# Patient Record
Sex: Female | Born: 1995 | Race: White | Hispanic: No | Marital: Single | Smoking: Never smoker
Health system: Southern US, Community
[De-identification: ages and names within clinical notes are randomized; demographics above are authoritative.]

## PROBLEM LIST (undated history)

## (undated) DIAGNOSIS — Z789 Other specified health status: Secondary | ICD-10-CM

## (undated) DIAGNOSIS — R011 Cardiac murmur, unspecified: Secondary | ICD-10-CM

## (undated) HISTORY — DX: Other specified health status: Z78.9

## (undated) HISTORY — PX: NO PAST SURGERIES: SHX2092

---

## 2018-08-08 NOTE — L&D Delivery Note (Addendum)
Delivery Note:  G2P0010 at [redacted]w[redacted]d  Admitting diagnosis: 38WKS CTX Risks: None Onset of labor: 06/18/2019 @ 0500 Augmentation: None ROM: SROM 06/18/2019 @ 1532  Complete dilation at 06/18/2019  @ 1440 Onset of pushing at 1644 FHR second stage Category 1  Analgesia /Anesthesia intrapartum:Epidural  Pushing in lithotomy position  Patient's step-mom, CNM, RN, SNM present for birth and supportive  Delivery of a Live born female  Birth Weight:  pending APGAR: 9, 9  Newborn Delivery   Birth date/time: 06/18/2019 17:26:00 Delivery type: Vaginal, Spontaneous      in vertex presentation, position ROT to ROA.  Nuchal Cord: No  Cord double clamped after cessation of pulsation, cut by patient's step-mom.  Collection of cord blood for typing completed. Cord blood donation-None  Arterial cord blood sample-No    Placenta delivered-Spontaneous  with 3 vessels . Uterotonics: IV Pitocin Placenta to L&D to be discarded. Uterine tone firm, bleeding light.  2nd degree  laceration, complete right labial laceration, left labial laceration identified.  2nd degree repaired with 2-0 Vicryl Rapide in standard fashion.   Complete right labial laceration and left labial laceration repaired with 3-0 vicryl rapide in standard fashion.  Episiotomy:None  Local analgesia: 1% lidocaine administered for repair  Est. Blood Loss (WL):798.92   Complications: None  APGAR:1 min-9 , 5 min-9  10 min-   Mom to postpartum.  Baby to Couplet care / Skin to Skin.  Delivery Report:   Review the Delivery Report for details.     Signed: Juanna Cao, SNM, BSN 06/18/2019, 6:24 PM  Midwife attestation: I was gloved and present for delivery in its entirety and I agree with the above student's note.  Julianne Handler, CNM 6:54 PM

## 2018-11-26 ENCOUNTER — Encounter (HOSPITAL_COMMUNITY): Payer: Self-pay | Admitting: Emergency Medicine

## 2018-11-26 ENCOUNTER — Other Ambulatory Visit: Payer: Self-pay

## 2018-11-26 ENCOUNTER — Emergency Department (HOSPITAL_COMMUNITY)
Admission: EM | Admit: 2018-11-26 | Discharge: 2018-11-26 | Disposition: A | Discharge status: Home / Self Care | Payer: Medicaid Other | Attending: Emergency Medicine | Admitting: Emergency Medicine

## 2018-11-26 ENCOUNTER — Emergency Department (HOSPITAL_COMMUNITY): Payer: Medicaid Other

## 2018-11-26 DIAGNOSIS — O219 Vomiting of pregnancy, unspecified: Secondary | ICD-10-CM | POA: Diagnosis not present

## 2018-11-26 DIAGNOSIS — Z3A09 9 weeks gestation of pregnancy: Secondary | ICD-10-CM | POA: Insufficient documentation

## 2018-11-26 DIAGNOSIS — O210 Mild hyperemesis gravidarum: Secondary | ICD-10-CM | POA: Diagnosis not present

## 2018-11-26 LAB — BASIC METABOLIC PANEL
Anion gap: 12 (ref 5–15)
BUN: 9 mg/dL (ref 6–20)
CO2: 21 mmol/L — ABNORMAL LOW (ref 22–32)
Calcium: 9.3 mg/dL (ref 8.9–10.3)
Chloride: 102 mmol/L (ref 98–111)
Creatinine, Ser: 0.43 mg/dL — ABNORMAL LOW (ref 0.44–1.00)
GFR calc Af Amer: >60 mL/min (ref >60)
GFR calc non Af Amer: >60 mL/min (ref >60)
Glucose, Bld: 74 mg/dL (ref 70–99)
Potassium: 3.4 mmol/L — ABNORMAL LOW (ref 3.5–5.1)
Sodium: 135 mmol/L (ref 135–145)

## 2018-11-26 LAB — CBC WITH DIFFERENTIAL/PLATELET
Abs Immature Granulocytes: 0.05 10*3/uL (ref 0.00–0.07)
Basophils Absolute: 0.1 10*3/uL (ref 0.0–0.1)
Basophils Relative: 0 %
Eosinophils Absolute: 0.1 10*3/uL (ref 0.0–0.5)
Eosinophils Relative: 1 %
HCT: 34.3 % — ABNORMAL LOW (ref 36.0–46.0)
Hemoglobin: 11.7 g/dL — ABNORMAL LOW (ref 12.0–15.0)
Immature Granulocytes: 0 %
Lymphocytes Relative: 17 %
Lymphs Abs: 2.1 10*3/uL (ref 0.7–4.0)
MCH: 30.2 pg (ref 26.0–34.0)
MCHC: 34.1 g/dL (ref 30.0–36.0)
MCV: 88.4 fL (ref 80.0–100.0)
Monocytes Absolute: 0.8 10*3/uL (ref 0.1–1.0)
Monocytes Relative: 6 %
Neutro Abs: 9.5 10*3/uL — ABNORMAL HIGH (ref 1.7–7.7)
Neutrophils Relative %: 76 %
Platelets: 257 10*3/uL (ref 150–400)
RBC: 3.88 MIL/uL (ref 3.87–5.11)
RDW: 13.6 % (ref 11.5–15.5)
WBC: 12.6 10*3/uL — ABNORMAL HIGH (ref 4.0–10.5)
nRBC: 0 % (ref 0.0–0.2)

## 2018-11-26 LAB — HCG, QUANTITATIVE, PREGNANCY: hCG, Beta Chain, Quant, S: 147611 m[IU]/mL — ABNORMAL HIGH (ref <5)

## 2018-11-26 MED ORDER — ONDANSETRON HCL 4 MG PO TABS: 4.0000 mg | ORAL_TABLET | Freq: Three times a day (TID) | ORAL | 0 refills | Status: DC | PRN

## 2018-11-26 MED ORDER — ONDANSETRON HCL 4 MG/2ML IJ SOLN
4.0000 mg | Freq: Once | INTRAMUSCULAR | Status: AC
  Administered 2018-11-26: 4 mg via INTRAVENOUS
  Filled 2018-11-26: qty 2

## 2018-11-26 MED ORDER — SODIUM CHLORIDE 0.9 % IV BOLUS
1000.0000 mL | Freq: Once | INTRAVENOUS | Status: AC
  Administered 2018-11-26: 1000 mL via INTRAVENOUS

## 2018-11-26 NOTE — Discharge Instructions (Addendum)
Your ultrasound did not show any significant abnormalities.  This time vomiting is not uncommon in the first trimester.  You will need to follow-up with GYN for further care.  Would suggest taking prenatal vitamins as well.

## 2018-11-26 NOTE — ED Notes (Signed)
Pt returned from ultrasound

## 2018-11-26 NOTE — ED Triage Notes (Signed)
Pt st's is is approx 9 weeks preg.  Has not seen a doctor but had a positive home preg test.  Pt sts she has been vomiting and unable to keep anything down.  Pt denies any abd pain or cramping.  LMP 09/21/2018

## 2018-11-26 NOTE — ED Notes (Signed)
Pt st's she is feeling much better at this time

## 2018-11-27 NOTE — ED Provider Notes (Signed)
[facility] EMERGENCY DEPARTMENT Provider Note   [id number] Ar[facility] date & time: 11/26/18  1712    History   Chief Complaint Chief Complaint  Patient presents with   Emesis During Pregnancy    HPI Ashlee Johnson is a 23 y.o. female.     HPI Patient presents to the emergency department with being about [redacted] weeks pregnant and concerned because she has not had any follow-up.  She states she is been vomiting fair amount over the last 4 weeks.  Patient states that nothing seems to make her condition better or worse.  The patient denies chest pain, shortness of breath, headache,blurred vision, neck pain, fever, cough, weakness, numbness, dizziness, anorexia, edema, abdominal pain, , diarrhea, rash, back pain, dysuria, hematemesis, bloody stool, near syncope, or syncope. History reviewed. No pertinent past medical history.  There are no active problems to display for this patient.   History reviewed. No pertinent surgical history.   OB History    Gravida  1   Para      Term      Preterm      AB      Living        SAB      TAB      Ectopic      Multiple      Live Births               Home Medications    Prior to Admission medications   Medication Sig Start Date End Date Taking? Authorizing Provider  Prenatal Vit-Fe Fumarate-FA (MULTIVITAMIN-PRENATAL) 27-0.8 MG TABS tablet Take 1 tablet by mouth daily at 12 noon.   Yes [provider]  ondansetron (ZOFRAN) 4 MG tablet Take 1 tablet (4 mg total) by mouth every 8 (eight) hours as needed for nausea or vomiting. 11/26/18   Marvette Schamp, Cristal Deerhristopher, PA-C    Family History No family history on file.  Social History Social History   Tobacco Use   Smoking status: Never Smoker   Smokeless tobacco: Former Engineer, waterUser  Substance Use Topics   Alcohol use: Never    Frequency: Never   Drug use: Never     Allergies   Percocet [oxycodone-acetaminophen]   Review of Systems Review of  Systems All other systems negative except as documented in the HPI. All pertinent positives and negatives as reviewed in the HPI.  Physical Exam Updated Vital Signs BP 98/62    Pulse 72    Temp 98.9 F (37.2 C) (Oral)    Resp 16    Ht 5\' 4"  (1.626 m)    Wt 52.6 kg    LMP 09/21/2018 (Exact Date)    SpO2 100%    BMI 19.91 kg/m   Physical Exam Vitals signs and nursing note reviewed.  Constitutional:      General: She is not in acute distress.    Appearance: She is well-developed.  HENT:     Head: Normocephalic and atraumatic.  Eyes:     Pupils: Pupils are equal, round, and reactive to light.  Neck:     Musculoskeletal: Normal range of motion and neck supple.  Cardiovascular:     Rate and Rhythm: Normal rate and regular rhythm.     Heart sounds: Normal heart sounds. No murmur. No friction rub. No gallop.   Pulmonary:     Effort: Pulmonary effort is normal. No respiratory distress.     Breath sounds: Normal breath sounds. No wheezing.  Abdominal:  General: Bowel sounds are normal. There is no distension.     Palpations: Abdomen is soft.     Tenderness: There is no abdominal tenderness.  Skin:    General: Skin is warm and dry.     Capillary Refill: Capillary refill takes less than 2 seconds.     Findings: No erythema or rash.  Neurological:     Mental Status: She is alert and oriented to person, place, and time.     Motor: No abnormal muscle tone.     Coordination: Coordination normal.  Psychiatric:        Behavior: Behavior normal.      ED Treatments / Results  Labs (all labs ordered are listed, but only abnormal results are displayed) Labs Reviewed  BASIC METABOLIC PANEL - Abnormal; Notable for the following components:      Result Value   Potassium 3.4 (*)    CO2 21 (*)    Creatinine, Ser 0.43 (*)    All other components within normal limits  CBC WITH DIFFERENTIAL/PLATELET - Abnormal; Notable for the following components:   WBC 12.6 (*)    Hemoglobin 11.7 (*)      HCT 34.3 (*)    Neutro Abs 9.5 (*)    All other components within normal limits  HCG, QUANTITATIVE, PREGNANCY - Abnormal; Notable for the following components:   hCG, Beta Chain, Quant, S 147,611 (*)    All other components within normal limits    EKG None  Radiology US Ob Comp Less 14 Wks  Result Date: 11/26/2018 CLINICAL DATA:  Abdominal pain EXAM: OBSTETRIC <14 WK [place] AND TRANSVAGINAL OB US TECHNIQUE: Both transabdominal and transvaginal ultrasound examinations were performed for complete evaluation of the gestation as well as the maternal uterus, adnexal regions, and pelvic cul-de-sac. Transvaginal technique was performed to assess early pregnancy. COMPARISON:  None. FINDINGS: Intrauterine gestational sac: Single Yolk sac:  Visualized Embryo:  Visualized Cardiac Activity: Visualized Heart Rate: 149 bpm MSD:   mm    w     d CRL:  24.2 mm   9 w   1 d                  [place] EDC: 06/30/2019 Subchorionic hemorrhage:  Small subchorionic hemorrhage Maternal uterus/adnexae: No adnexal mass or free fluid. Complex corpus luteal cyst in the right ovary measures 2.1 cm. IMPRESSION: 9 week 1 day intrauterine pregnancy. Fetal heart rate 149 beats per minute. Small subchorionic hemorrhage. Electronically Signed   By: Charlett Nose M.D.   On: 11/26/2018 21:59   US Ob Transvaginal  Result Date: 11/26/2018 CLINICAL DATA:  Abdominal pain EXAM: OBSTETRIC <14 WK [place] AND TRANSVAGINAL OB US TECHNIQUE: Both transabdominal and transvaginal ultrasound examinations were performed for complete evaluation of the gestation as well as the maternal uterus, adnexal regions, and pelvic cul-de-sac. Transvaginal technique was performed to assess early pregnancy. COMPARISON:  None. FINDINGS: Intrauterine gestational sac: Single Yolk sac:  Visualized Embryo:  Visualized Cardiac Activity: Visualized Heart Rate: 149 bpm MSD:   mm    w     d CRL:  24.2 mm   9 w   1 d                  [place] EDC: 06/30/2019 Subchorionic hemorrhage:  Small  subchorionic hemorrhage Maternal uterus/adnexae: No adnexal mass or free fluid. Complex corpus luteal cyst in the right ovary measures 2.1 cm. IMPRESSION: 9 week 1 day intrauterine pregnancy. Fetal heart rate 149 beats  per minute. Small subchorionic hemorrhage. Electronically Signed   By: Charlett Nose M.D.   On: 11/26/2018 21:59    Procedures Procedures (including critical care time)  Medications Ordered in ED Medications  sodium chloride 0.9 % bolus 1,000 mL (0 mLs Intravenous Stopped 11/26/18 2048)  ondansetron (ZOFRAN) injection 4 mg (4 mg Intravenous Given 11/26/18 1856)     Initial Impression / Assessment and Plan / ED Course  I have reviewed the triage vital signs and the nursing notes.  Pertinent labs & imaging results that were available during my care of the patient were reviewed by me and considered in my medical decision making (see chart for details).        Patient will be referred to GYN.  Her ultrasound and laboratory testing does not yield any significant abnormalities.  I do feel she is dehydrated.  Given IV fluids and antiemetics.  Patient is feeling better at this time.  Final Clinical Impressions(s) / ED Diagnoses   Final diagnoses:  Vomiting pregnancy    ED Discharge Orders         Ordered    ondansetron (ZOFRAN) 4 MG tablet  Every 8 hours PRN     11/26/18 2216           Charlestine Night, PA-C 11/27/18 2341    Sabas Sous, MD 11/28/18 1504

## 2018-12-10 ENCOUNTER — Other Ambulatory Visit: Payer: Self-pay

## 2018-12-10 ENCOUNTER — Ambulatory Visit (INDEPENDENT_AMBULATORY_CARE_PROVIDER_SITE_OTHER): Payer: Self-pay | Admitting: *Deleted

## 2018-12-10 VITALS — Wt 120.0 lb

## 2018-12-10 DIAGNOSIS — Z113 Encounter for screening for infections with a predominantly sexual mode of transmission: Secondary | ICD-10-CM

## 2018-12-10 DIAGNOSIS — Z124 Encounter for screening for malignant neoplasm of cervix: Secondary | ICD-10-CM

## 2018-12-10 DIAGNOSIS — Z3400 Encounter for supervision of normal first pregnancy, unspecified trimester: Secondary | ICD-10-CM | POA: Insufficient documentation

## 2018-12-10 MED ORDER — VITAFOL GUMMIES 3.33-0.333-34.8 MG PO CHEW: 3.0000 | CHEWABLE_TABLET | Freq: Every day | ORAL | 12 refills | Status: DC

## 2018-12-10 MED ORDER — PROMETHAZINE HCL 25 MG PO TABS: 25.0000 mg | ORAL_TABLET | Freq: Four times a day (QID) | ORAL | 0 refills | Status: DC | PRN

## 2018-12-10 NOTE — Progress Notes (Signed)
    Virtual Visit via Telephone Note  I connected with Ashlee Johnson on 12/10/18 at  1:30 PM EDT by telephone and verified that I am speaking with the correct person using two identifiers.  Location: [facility] Patient: Ashlee Johnson Provider: Dorisann Frames, BSN, RN-BC   I discussed the limitations, risks, security and privacy concerns of performing an evaluation and management service by telephone and the availability of in person appointments. I also discussed with the patient that there may be a patient responsible charge related to this service. The patient expressed understanding and agreed to proceed.   History of Present Illness: PRENATAL INTAKE SUMMARY  [name] presents today New OB Nurse Interview.  OB History    Gravida  2   Para      Term      Preterm      AB  1   Living        SAB  1   TAB      Ectopic      Multiple      Live Births             I have reviewed the patient's medical, obstetrical, social, and family histories, medications, and available lab results.  SUBJECTIVE She has no unusual complaints and complains of nausea with vomiting off and on since April 20 th, 2020.    Observations/Objective: Initial nurse interview for history (New OB).  GENERAL APPEARANCE: oriented to person, place and time, non-face to face interview.  EDD: 06/28/2019 by LMP GA: [redacted]w[redacted]d G2P0010 FHT: non-face to face  Assessment and Plan: Normal pregnancy Prenatal lab work/physical exam will be completed at next visit with midwife. Patient to enroll in Babyscripts Prenatal gummies Rx sent to pharmacy Phenergan 25 mg PO Rx sent to pharmacy for n/v  Follow Up Instructions:   I discussed the assessment and treatment plan with the patient. The patient was provided an opportunity to ask questions and all were answered. The patient agreed with the plan and demonstrated an understanding of the instructions.   The patient was advised to call back or  seek an in-person evaluation if the symptoms worsen or if the condition fails to improve as anticipated.  I provided 15 minutes of non-face-to-face time during this encounter.   Ashlee Pu, RN

## 2019-01-02 ENCOUNTER — Other Ambulatory Visit: Payer: Self-pay

## 2019-01-02 ENCOUNTER — Encounter: Payer: Self-pay | Admitting: Certified Nurse Midwife

## 2019-01-02 ENCOUNTER — Other Ambulatory Visit (HOSPITAL_COMMUNITY)
Admission: RE | Admit: 2019-01-02 | Discharge: 2019-01-02 | Disposition: A | Discharge status: Home / Self Care | Payer: Medicaid Other | Source: Ambulatory Visit | Attending: Certified Nurse Midwife | Admitting: Certified Nurse Midwife

## 2019-01-02 ENCOUNTER — Ambulatory Visit (INDEPENDENT_AMBULATORY_CARE_PROVIDER_SITE_OTHER): Payer: Medicaid Other | Admitting: Certified Nurse Midwife

## 2019-01-02 VITALS — BP 110/68 | HR 81 | Temp 98.6°F | Wt 124.0 lb

## 2019-01-02 DIAGNOSIS — Z3400 Encounter for supervision of normal first pregnancy, unspecified trimester: Secondary | ICD-10-CM | POA: Diagnosis not present

## 2019-01-02 DIAGNOSIS — O26892 Other specified pregnancy related conditions, second trimester: Secondary | ICD-10-CM | POA: Diagnosis not present

## 2019-01-02 DIAGNOSIS — Z124 Encounter for screening for malignant neoplasm of cervix: Secondary | ICD-10-CM

## 2019-01-02 DIAGNOSIS — Z113 Encounter for screening for infections with a predominantly sexual mode of transmission: Secondary | ICD-10-CM

## 2019-01-02 DIAGNOSIS — N898 Other specified noninflammatory disorders of vagina: Secondary | ICD-10-CM | POA: Insufficient documentation

## 2019-01-02 DIAGNOSIS — Z3A14 14 weeks gestation of pregnancy: Secondary | ICD-10-CM | POA: Diagnosis not present

## 2019-01-02 MED ORDER — TERCONAZOLE 0.4 % VA CREA: 1.0000 | TOPICAL_CREAM | Freq: Every day | VAGINAL | 0 refills | Status: DC

## 2019-01-02 MED ORDER — PROMETHAZINE HCL 25 MG PO TABS: 25.0000 mg | ORAL_TABLET | Freq: Four times a day (QID) | ORAL | 1 refills | Status: DC | PRN

## 2019-01-03 LAB — OBSTETRIC PANEL, INCLUDING HIV
Antibody Screen: NEGATIVE
Basophils Absolute: 0.1 x10E3/uL (ref 0.0–0.2)
Basos: 0 %
EOS (ABSOLUTE): 0.2 x10E3/uL (ref 0.0–0.4)
Eos: 1 %
HIV Screen 4th Generation wRfx: NONREACTIVE
Hematocrit: 35.6 % (ref 34.0–46.6)
Hemoglobin: 12 g/dL (ref 11.1–15.9)
Hepatitis B Surface Ag: NEGATIVE
Immature Grans (Abs): 0 x10E3/uL (ref 0.0–0.1)
Immature Granulocytes: 0 %
Lymphocytes Absolute: 2.6 x10E3/uL (ref 0.7–3.1)
Lymphs: 20 %
MCH: 30.3 pg (ref 26.6–33.0)
MCHC: 33.7 g/dL (ref 31.5–35.7)
MCV: 90 fL (ref 79–97)
Monocytes Absolute: 0.8 x10E3/uL (ref 0.1–0.9)
Monocytes: 6 %
Neutrophils Absolute: 9.5 x10E3/uL — ABNORMAL HIGH (ref 1.4–7.0)
Neutrophils: 73 %
Platelets: 308 x10E3/uL (ref 150–450)
RBC: 3.96 x10E6/uL (ref 3.77–5.28)
RDW: 13.5 % (ref 11.7–15.4)
RPR Ser Ql: NONREACTIVE
Rh Factor: POSITIVE
Rubella Antibodies, IGG: 1.72 {index}
WBC: 13.2 x10E3/uL — ABNORMAL HIGH (ref 3.4–10.8)

## 2019-01-03 NOTE — Progress Notes (Signed)
History:   Ashlee Johnson is a 23 y.o. G2P0010 at 8775w6d by LMP with is consistent with early US at 5030w1d being seen today for her first obstetrical visit.  Her obstetrical history is significant for nothing. Patient does intend to breast feed. Pregnancy history fully reviewed.  Patient reports no complaints.     HISTORY: OB History  Gravida Para Term Preterm AB Living  2 0 0 0 1 0  SAB TAB Ectopic Multiple Live Births  1 0 0 0 0    # Outcome Date GA Lbr Len/2nd Weight Sex Delivery Anes PTL Lv  2 Current           1 SAB 2018            She has never had pap smear   Past Medical History:  Diagnosis Date  . Medical history non-contributory    Past Surgical History:  Procedure Laterality Date  . NO PAST SURGERIES     Family History  Problem Relation Age of Onset  . Heart disease Father   . Cancer Father    Social History   Tobacco Use  . Smoking status: Never Smoker  . Smokeless tobacco: Never Used  Substance Use Topics  . Alcohol use: Not Currently    Frequency: Never  . Drug use: Never   Allergies  Allergen Reactions  . Percocet [Oxycodone-Acetaminophen] Hives and Nausea And Vomiting   Current Outpatient Medications on File Prior to Visit  Medication Sig Dispense Refill  . Prenatal Vit-Fe Phos-FA-Omega (VITAFOL GUMMIES) 3.33-0.333-34.8 MG CHEW Chew 3 each by mouth daily. 90 tablet 12   No current facility-administered medications on file prior to visit.     Review of Systems Pertinent items noted in HPI and remainder of comprehensive ROS otherwise negative. Physical Exam:   Vitals:   01/02/19 1522  BP: 110/68  Pulse: 81  Temp: 98.6 F (37 C)  Weight: 124 lb (56.2 kg)   Fetal Heart Rate (bpm): 160 Pelvic Exam: Perineum: no hemorrhoids, normal perineum   Vulva: normal external genitalia, no lesions   Vagina:  normal mucosa, large amount of yellow curdy discharge with active clear mucous discharge from cervical os, with odor, cervix friable with pap  smear    Cervix: no lesions and normal, pap smear done.    Adnexa: normal adnexa and no mass, fullness, tenderness   Bony Pelvis: average  System: General: well-developed, well-nourished female in no acute distress   Breasts:  normal appearance, no masses or tenderness bilaterally   Skin: normal coloration and turgor, no rashes   Neurologic: oriented, normal, negative, normal mood   Extremities: normal strength, tone, and muscle mass, ROM of all joints is normal   HEENT PERRLA, extraocular movement intact and sclera clear   Mouth/Teeth mucous membranes moist, pharynx normal without lesions and dental hygiene good   Neck supple and no masses   Cardiovascular: regular rate and rhythm   Respiratory:  no respiratory distress, normal breath sounds   Abdomen: soft, non-tender; bowel sounds normal; no masses,  no organomegaly     Assessment:    Pregnancy: G2P0010 Patient Active Problem List   Diagnosis Date Noted  . Vaginal discharge during pregnancy in second trimester 01/02/2019  . Supervision of normal first pregnancy, antepartum 12/10/2018     Plan:    1. Supervision of normal first pregnancy, antepartum - Welcomed to practice and introduced self to patient  - Anticipatory guidance on appointment during COVID19 pandemic  - COVID19 precautions discussed  -  Order for anatomy US placed  - Reviewed use of babyscripts app  - Cervicovaginal ancillary only( [place]) - Cytology - PAP( [place]) - Culture, OB Urine - Genetic Screening - Obstetric Panel, Including HIV - promethazine (PHENERGAN) 25 MG tablet; Take 1 tablet (25 mg total) by mouth every 6 (six) hours as needed for nausea or vomiting.  Dispense: 30 tablet; Refill: 1 - [place] MFM OB COMP + 14 WK; Future  2. Screening examination for STD (sexually transmitted disease) - Cervicovaginal ancillary only( [place])  3. Pap smear for cervical cancer screening - Cytology - PAP( [place])  4. Vaginal discharge during  pregnancy in second trimester - Will prophylactically treat for yeast infection  - Results of cervicovaginal ancillary swab pending, will manage accordingly  - terconazole (TERAZOL 7) 0.4 % vaginal cream; Place 1 applicator vaginally at bedtime. Use for seven days  Dispense: 45 g; Refill: 0   Initial labs drawn. Continue prenatal vitamins. Genetic Screening discussed, NIPS: ordered. Ultrasound discussed; fetal anatomic survey: ordered. Problem list reviewed and updated. The nature of  - [facility] with multiple MDs and other Advanced Practice Providers was explained to patient; also emphasized that residents, students are part of our team. Routine obstetric precautions reviewed. Return in about 30 days (around 02/01/2019) for ROB-mychart visit .     Sharyon Cable, CNM Center for [employer], [facility]

## 2019-01-04 ENCOUNTER — Encounter: Payer: Self-pay | Admitting: General Practice

## 2019-01-04 LAB — CULTURE, OB URINE

## 2019-01-04 LAB — URINE CULTURE, OB REFLEX

## 2019-01-07 LAB — CERVICOVAGINAL ANCILLARY ONLY
Bacterial vaginitis: NEGATIVE
Candida vaginitis: POSITIVE — AB
Chlamydia: NEGATIVE
Neisseria Gonorrhea: NEGATIVE
Trichomonas: NEGATIVE

## 2019-01-08 LAB — CYTOLOGY - PAP: Diagnosis: NEGATIVE

## 2019-01-09 ENCOUNTER — Telehealth: Payer: Self-pay | Admitting: *Deleted

## 2019-01-09 NOTE — Telephone Encounter (Signed)
Patient called regarding test results from last office visit. Patient's PAP was normal and +yeast. Pt is almost complete with vaginal yeast cream.  No other concerns.  Clovis Pu, RN

## 2019-01-10 ENCOUNTER — Encounter: Payer: Self-pay | Admitting: General Practice

## 2019-01-14 ENCOUNTER — Encounter: Payer: Self-pay | Admitting: General Practice

## 2019-01-30 ENCOUNTER — Telehealth (INDEPENDENT_AMBULATORY_CARE_PROVIDER_SITE_OTHER): Payer: Medicaid Other | Admitting: Obstetrics and Gynecology

## 2019-01-30 ENCOUNTER — Encounter: Payer: Self-pay | Admitting: Obstetrics and Gynecology

## 2019-01-30 DIAGNOSIS — N898 Other specified noninflammatory disorders of vagina: Secondary | ICD-10-CM

## 2019-01-30 DIAGNOSIS — O26892 Other specified pregnancy related conditions, second trimester: Secondary | ICD-10-CM

## 2019-01-30 DIAGNOSIS — Z3400 Encounter for supervision of normal first pregnancy, unspecified trimester: Secondary | ICD-10-CM

## 2019-01-30 DIAGNOSIS — Z3A18 18 weeks gestation of pregnancy: Secondary | ICD-10-CM

## 2019-01-30 MED ORDER — BLOOD PRESSURE MONITOR AUTOMAT DEVI: 0 refills | Status: DC

## 2019-01-30 NOTE — Progress Notes (Signed)
   MY CHART VIDEO VIRTUAL OBSTETRICS VISIT ENCOUNTER NOTE  I connected with Ashlee Johnson on 01/30/19 at  1:50 PM EDT by My Chart video at home and verified that I am speaking with the correct person using two identifiers.   I discussed the limitations, risks, security and privacy concerns of performing an evaluation and management service by My Chart video and the availability of in person appointments. I also discussed with the patient that there may be a patient responsible charge related to this service. The patient expressed understanding and agreed to proceed.  Subjective:  Ashlee Johnson is a 23 y.o. G2P0010 at [redacted]w[redacted]d being followed for ongoing prenatal care.  She is currently monitored for the following issues for this low-risk pregnancy and has Supervision of normal first pregnancy, antepartum and Vaginal discharge during pregnancy in second trimester on their problem list.  Patient reports no complaints. Reports fetal movement. Denies any contractions, bleeding or leaking of fluid.   The following portions of the patient's history were reviewed and updated as appropriate: allergies, current medications, past family history, past medical history, past social history, past surgical history and problem list.   Objective:   General:  Alert, oriented and cooperative.   Mental Status: Normal mood and affect perceived. Normal judgment and thought content.  Rest of physical exam deferred due to type of encounter  Assessment and Plan:  Pregnancy: G2P0010 at [redacted]w[redacted]d 1. Vaginal discharge during pregnancy in second trimester - Not irritating, possibly normal for gestation  2. Supervision of normal first pregnancy, antepartum - Blood Pressure Monitoring (BLOOD PRESSURE MONITOR AUTOMAT) DEVI; Appropriate size cuff (forearm circumference) with automatic BP monitor. To be monitored regularly at home. ICD-10 code: Z34.80, Supervision of other normal pregnancy.  Dispense: 1 Device; Refill: 0   Preterm labor symptoms and general obstetric precautions including but not limited to vaginal bleeding, contractions, leaking of fluid and fetal movement were reviewed in detail with the patient.  I discussed the assessment and treatment plan with the patient. The patient was provided an opportunity to ask questions and all were answered. The patient agreed with the plan and demonstrated an understanding of the instructions. The patient was advised to call back or seek an in-person office evaluation/go to [facility]U at [facility] for any urgent or concerning symptoms. Please refer to After Visit Summary for other counseling recommendations.   I provided 5 minutes of non-face-to-face time during this encounter. There was 5 minutes of chart review time spent prior to this encounter. Total time spent = 10 minutes.   Return in about 6 weeks (around 03/13/2019) for Return OB - My Chart video.  Future Appointments  Date Time Provider [place]  02/01/2019 10:30 AM WH-MFC [place] 1 WH-MFCUS MFC-US    Valerio Pinard, [place] for [employer], [place]

## 2019-02-01 ENCOUNTER — Ambulatory Visit (HOSPITAL_COMMUNITY)
Admission: RE | Admit: 2019-02-01 | Discharge: 2019-02-01 | Disposition: A | Discharge status: Home / Self Care | Payer: Medicaid Other | Source: Ambulatory Visit | Attending: Obstetrics and Gynecology | Admitting: Obstetrics and Gynecology

## 2019-02-01 ENCOUNTER — Other Ambulatory Visit (HOSPITAL_COMMUNITY): Payer: Self-pay | Admitting: *Deleted

## 2019-02-01 ENCOUNTER — Other Ambulatory Visit: Payer: Self-pay

## 2019-02-01 DIAGNOSIS — Z363 Encounter for antenatal screening for malformations: Secondary | ICD-10-CM | POA: Diagnosis not present

## 2019-02-01 DIAGNOSIS — Z362 Encounter for other antenatal screening follow-up: Secondary | ICD-10-CM

## 2019-02-01 DIAGNOSIS — Z3400 Encounter for supervision of normal first pregnancy, unspecified trimester: Secondary | ICD-10-CM | POA: Diagnosis not present

## 2019-02-01 DIAGNOSIS — Z3A18 18 weeks gestation of pregnancy: Secondary | ICD-10-CM

## 2019-02-04 DIAGNOSIS — Z3480 Encounter for supervision of other normal pregnancy, unspecified trimester: Secondary | ICD-10-CM | POA: Diagnosis not present

## 2019-03-01 ENCOUNTER — Encounter (HOSPITAL_COMMUNITY): Payer: Self-pay

## 2019-03-01 ENCOUNTER — Ambulatory Visit (HOSPITAL_COMMUNITY): Payer: Medicaid Other | Admitting: *Deleted

## 2019-03-01 ENCOUNTER — Ambulatory Visit (HOSPITAL_COMMUNITY)
Admission: RE | Admit: 2019-03-01 | Discharge: 2019-03-01 | Disposition: A | Discharge status: Home / Self Care | Payer: Medicaid Other | Source: Ambulatory Visit | Attending: Obstetrics and Gynecology | Admitting: Obstetrics and Gynecology

## 2019-03-01 ENCOUNTER — Other Ambulatory Visit: Payer: Self-pay

## 2019-03-01 VITALS — BP 114/57 | HR 88 | Temp 98.2°F

## 2019-03-01 DIAGNOSIS — Z362 Encounter for other antenatal screening follow-up: Secondary | ICD-10-CM

## 2019-03-01 DIAGNOSIS — O0990 Supervision of high risk pregnancy, unspecified, unspecified trimester: Secondary | ICD-10-CM

## 2019-03-01 DIAGNOSIS — Z3A22 22 weeks gestation of pregnancy: Secondary | ICD-10-CM

## 2019-03-07 ENCOUNTER — Other Ambulatory Visit: Payer: Self-pay

## 2019-03-07 ENCOUNTER — Encounter: Payer: Self-pay | Admitting: Obstetrics and Gynecology

## 2019-03-07 ENCOUNTER — Telehealth (INDEPENDENT_AMBULATORY_CARE_PROVIDER_SITE_OTHER): Payer: Medicaid Other | Admitting: Obstetrics and Gynecology

## 2019-03-07 DIAGNOSIS — Z3400 Encounter for supervision of normal first pregnancy, unspecified trimester: Secondary | ICD-10-CM

## 2019-03-07 DIAGNOSIS — Z3A23 23 weeks gestation of pregnancy: Secondary | ICD-10-CM

## 2019-03-07 DIAGNOSIS — N898 Other specified noninflammatory disorders of vagina: Secondary | ICD-10-CM

## 2019-03-07 DIAGNOSIS — O26892 Other specified pregnancy related conditions, second trimester: Secondary | ICD-10-CM

## 2019-03-07 NOTE — Progress Notes (Signed)
   MY CHART VIDEO VIRTUAL OBSTETRICS VISIT ENCOUNTER NOTE  I connected with Ashlee Johnson on 03/07/19 at 10:10 AM EDT by My Chart video at home and verified that I am speaking with the correct person using two identifiers.   I discussed the limitations, risks, security and privacy concerns of performing an evaluation and management service by My Chart video and the availability of in person appointments. I also discussed with the patient that there may be a patient responsible charge related to this service. The patient expressed understanding and agreed to proceed.  Subjective:  Ashlee Johnson is a 23 y.o. G2P0010 at [redacted]w[redacted]d being followed for ongoing prenatal care.  She is currently monitored for the following issues for this low-risk pregnancy and has Supervision of normal first pregnancy, antepartum and Vaginal discharge during pregnancy in second trimester on their problem list.  Patient reports no complaints. Wants to know if she can fly to [place] during the middle of August for 2 days. Reports fetal movement. Denies any contractions, bleeding or leaking of fluid.   The following portions of the patient's history were reviewed and updated as appropriate: allergies, current medications, past family history, past medical history, past social history, past surgical history and problem list.   Objective:   General:  Alert, oriented and cooperative.   Mental Status: Normal mood and affect perceived. Normal judgment and thought content.  Rest of physical exam deferred due to type of encounter  BP 117/79   Pulse 79   LMP 09/21/2018 (Exact Date)  **Done by patient's own at home BP cuff   Assessment and Plan:  Pregnancy: G2P0010 at [redacted]w[redacted]d  Supervision of normal first pregnancy, antepartum  - Anticipatory guidance for 2 hr GTT and labs at nv - Advised it would be safe to travel at the time in pregnancy - Advised to wear mask when in public places or around someone she is unsure of their  COVID-19 status, practice social distancing, wash hands frequently, and monitor self for s/sxs of COVID-19 upon return back to [place] that if she thinks she has been exposed to COVID-19 that she would not need any testing for no symptoms. If she start to develop any symptoms to call her PCP, go to urgent care or ED for further evaluation. If she prefer she can self quarantine for 2 weeks.    Preterm labor symptoms and general obstetric precautions including but not limited to vaginal bleeding, contractions, leaking of fluid and fetal movement were reviewed in detail with the patient.  I discussed the assessment and treatment plan with the patient. The patient was provided an opportunity to ask questions and all were answered. The patient agreed with the plan and demonstrated an understanding of the instructions. The patient was advised to call back or seek an in-person office evaluation/go to [facility] at [facility] for any urgent or concerning symptoms. Please refer to After Visit Summary for other counseling recommendations.   I provided 5 minutes of non-face-to-face time during this encounter. There was 5 minutes of chart review time spent prior to this encounter. Total time spent = 10 minutes.  Return in about 4 weeks (around 04/04/2019) for Return OB 2hr GTT.  Future Appointments  Date Time Provider [place]  04/03/2019  8:10 AM Laury Deep, CNM [facility] None    Laury Deep, [place] for [employer], [place]

## 2019-03-07 NOTE — Patient Instructions (Signed)
Pregnancy and Travel  Most pregnant women can safely travel until the last month of their pregnancy. Your doctor may recommend limiting or avoiding travel depending on how far you are in the pregnancy, and if you have any medical or pregnancy problems. General travel tips Before you go:  Discuss your trip with your doctor. Get examined shortly before you go.  Get a copy of your medical records. Take it with you.  Try to get names of doctors and hospitals in the area where you will be visiting.  Pack your pillow.  Pack any approved medicines and supplements.  Get enough sleep the night before the trip. During your trip:  Ask for locations of doctors and hospitals.  Wear flat, comfortable shoes.  Wear loose-fitting, comfortable clothes.  Wear compression stockings as told by your doctor. They may prevent blood clots that arise from sitting for a long time.  Do leg exercises as told by your doctor.  Eat a balanced diet, drink lots of fluid, and take your vitamins and supplements.  Take water, crackers, and fruit with you.  Take breaks to use the restroom and walk every 2 hours or during stops.  Do not wear yourself out.  Do not ride on a motorcycle.  Rest. If your trip is long, lie down for 30 or more minutes with your feet slightly raised after you reach your destination.  Always wear a seat belt. Tips for traveling to a foreign country Before you go:  Ask your doctor if there are medicines that are safe for you to take if you get diarrhea, constipation, nausea, or vomiting.  Check with your health insurance provider about medical coverage abroad. Purchase travel [employer], if needed.  Make sure you are up to date on vaccines. During your trip:  Do not eat uncooked foods.  Do not eat food from buffets or food that is cold or sitting at room temperature.  Drink bottled beverages and water. Do not use ice.  Wash fruits and vegetables with clean water. If  possible, peel them before eating.  Do not drink unpasteurized milk.  Wear insect repellent if there are mosquitoes or other biting insects. Ask your doctor which repellents are safe. What do I need to know about traveling by car?  Wear your seat belt properly. The belt should be buckled below your abdomen, on your hip bones. The shoulder belt should be off to the side of your abdomen and across the center of your chest.  If you are in the front seat, sit as far away from the dashboard as possible to avoid getting hit hard if the airbag deploys in an accident.  Do not travel for more than 5-6 hours a day. What do I need to know about traveling by bus?  Before making a reservation, ask whether your bus will have a restroom.  Move your arms and legs when seated.  If you have to use the restroom, hold on to the seats and handrails as you walk.  Do not travel for more than 5-6 hours a day. What do I need to know about traveling by train?  Before making a reservation, ask if your train will have a sleeping car and more than one restroom.  If you need to walk while the train is moving, hold on to seats and handrails.  Move your arms and legs when seated.  Do not travel for more than 5-6 hours a day. What do I need to know about traveling  by airplane?  Before booking your trip, ask about the airline's rules about pregnancy. Pregnant women may be restricted from [place] after a certain time of the pregnancy. Every airline has its own rules.  Make sure you complete your trip before 36 weeks of pregnancy.  Ask whether the airplane cabin will be pressurized. Do not board an unpressurized plane that will fly above 7,000 ft (2,100 m).  Try to get a bulkhead or an aisle seat so it is easier to get up, stretch, and use the bathroom.  Wear layers since the cabin temperature can change.  Put all your medicines and medical records in your carry-on bag.  Avoid drinking caffeinated or  carbonated beverages.  Avoid eating foods that may make you bloated.  Do not eat a big meal.  If you need to walk through the airplane, hold on to the seats and handrails.  Move your arms and legs when seated.  Wear your seat belt. What do I need to know about traveling by cruise ship?  Before booking your trip, ask the [place]: ? Are pregnant women allowed on the ship? ? Is there a medical facility and doctor on board? ? Does the ship dock in places where there are doctors and medical facilities?  Before booking your trip, ask your doctor: ? Is it safe to take medicines if I get seasick? ? Is it safe to wear acupressure wristbands to prevent seasickness? If the answer is yes, consider buying one. Contact a health care provider if:  You have diarrhea.  You vomit.  You have nausea or seasickness. Get help right away if:  You have vaginal bleeding.  You have severe vomiting or diarrhea.  You have pelvic or abdominal pain.  You have contractions.  Your water breaks.  You have a persistent headache.  Your eyesight changes or you see spots.  Your face or hands are swollen.  You have pain, warmth, or swelling in your legs or ankles. Summary  Most pregnant women can safely travel until the last month of their pregnancy. Your doctor may tell you to limit or avoid travel depending on how far you are in your pregnancy and if you have any medical or pregnancy problems.  The best time to travel is between 14 and 28 weeks of your pregnancy.  Before you go on your trip, make sure you discuss your trip with your health care provider, get a copy of your medical records, and try to get information on medical centers and doctors at your destination.  While on your trip, make sure you wear comfortable clothes and shoes, eat a healthy diet, drink plenty of fluids, take your vitamins and supplements, take breaks and rest often, and wear your seat belt.  Before booking  a flight, ask about the airline's rules about pregnancy. Every airline has its own rules. Do the same for a train, bus, or cruise ship. This information is not intended to replace advice given to you by your health care provider. Make sure you discuss any questions you have with your health care provider. Document Released: 07/07/2008 Document Revised: 11/16/2018 Document Reviewed: 08/30/2016 Elsevier Patient Education  2020 [employer].

## 2019-04-03 ENCOUNTER — Ambulatory Visit (INDEPENDENT_AMBULATORY_CARE_PROVIDER_SITE_OTHER): Payer: Medicaid Other | Admitting: Obstetrics and Gynecology

## 2019-04-03 ENCOUNTER — Other Ambulatory Visit: Payer: Self-pay

## 2019-04-03 ENCOUNTER — Encounter: Payer: Self-pay | Admitting: Obstetrics and Gynecology

## 2019-04-03 VITALS — BP 106/57 | HR 76 | Temp 98.0°F | Wt 146.0 lb

## 2019-04-03 DIAGNOSIS — Z3400 Encounter for supervision of normal first pregnancy, unspecified trimester: Secondary | ICD-10-CM | POA: Diagnosis not present

## 2019-04-03 DIAGNOSIS — O26892 Other specified pregnancy related conditions, second trimester: Secondary | ICD-10-CM

## 2019-04-03 DIAGNOSIS — R12 Heartburn: Secondary | ICD-10-CM

## 2019-04-03 DIAGNOSIS — Z3A27 27 weeks gestation of pregnancy: Secondary | ICD-10-CM

## 2019-04-03 MED ORDER — FAMOTIDINE 20 MG PO TABS: 20.0000 mg | ORAL_TABLET | Freq: Two times a day (BID) | ORAL | 2 refills | Status: DC

## 2019-04-03 NOTE — Progress Notes (Signed)
   PRENATAL VISIT NOTE  Subjective:  Ashlee Johnson is a 23 y.o. G2P0010 at [redacted]w[redacted]d being seen today for ongoing prenatal care.  She is currently monitored for the following issues for this low-risk pregnancy and has Supervision of normal first pregnancy, antepartum and Vaginal discharge during pregnancy in second trimester on their problem list.  Patient reports no complaints. She just returned yesterday 04/02/2019 from [place]. She states she went to see her FOB and to relax on the beaches. Contractions: Not present. Vag. Bleeding: None.  Movement: Present. Denies leaking of fluid.   The following portions of the patient's history were reviewed and updated as appropriate: allergies, current medications, past family history, past medical history, past social history, past surgical history and problem list.   Objective:   Vitals:   04/03/19 0825  BP: (!) 106/57  Pulse: 76  Temp: 98 F (36.7 C)  Weight: 146 lb (66.2 kg)    Fetal Status: Fetal Heart Rate (bpm): 152 Fundal Height: 25 cm Movement: Present  Presentation: Undeterminable  General:  Alert, oriented and cooperative. Patient is in no acute distress.  Skin: Skin is warm and dry. No rash noted.   Cardiovascular: Normal heart rate noted  Respiratory: Normal respiratory effort, no problems with respiration noted  Abdomen: Soft, gravid, appropriate for gestational age.  Pain/Pressure: Absent     Pelvic: Cervical exam deferred        Extremities: Normal range of motion.  Edema: None  Mental Status: Normal mood and affect. Normal behavior. Normal judgment and thought content.   Assessment and Plan:  Pregnancy: G2P0010 at [redacted]w[redacted]d 1. Supervision of normal first pregnancy, antepartum - Declined TdaP  - Glucose Tolerance, 2 Hours w/1 Hour - CBC - HIV Antibody (routine testing w rflx) - RPR - Anticipatory guidance for [facility] visit at 32 wks  Preterm labor symptoms and general obstetric precautions including but not limited to vaginal  bleeding, contractions, leaking of fluid and fetal movement were reviewed in detail with the patient. Please refer to After Visit Summary for other counseling recommendations.   Return in about 5 weeks (around 05/08/2019) for Return OB - My Chart video.  Future Appointments  Date Time Provider [place]  05/09/2019 10:10 AM Laury Deep, [place] None    Laury Deep, [place]

## 2019-04-03 NOTE — Patient Instructions (Signed)
Fetal Movement Counts Patient Name: ________________________________________________ Patient Due Date: ____________________ What is a fetal movement count?  A fetal movement count is the number of times that you feel your baby move during a certain amount of time. This may also be called a fetal kick count. A fetal movement count is recommended for every pregnant woman. You may be asked to start counting fetal movements as early as week 28 of your pregnancy. Pay attention to when your baby is most active. You may notice your baby's sleep and wake cycles. You may also notice things that make your baby move more. You should do a fetal movement count:  When your baby is normally most active.  At the same time each day. A good time to count movements is while you are resting, after having something to eat and drink. How do I count fetal movements? 1. Find a quiet, comfortable area. Sit, or lie down on your side. 2. Write down the date, the start time and stop time, and the number of movements that you felt between those two times. Take this information with you to your health care visits. 3. For 2 hours, count kicks, flutters, swishes, rolls, and jabs. You should feel at least 10 movements during 2 hours. 4. You may stop counting after you have felt 10 movements. 5. If you do not feel 10 movements in 2 hours, have something to eat and drink. Then, keep resting and counting for 1 hour. If you feel at least 4 movements during that hour, you may stop counting. Contact a health care provider if:  You feel fewer than 4 movements in 2 hours.  Your baby is not moving like he or she usually does. Date: ____________ Start time: ____________ Stop time: ____________ Movements: ____________ Date: ____________ Start time: ____________ Stop time: ____________ Movements: ____________ Date: ____________ Start time: ____________ Stop time: ____________ Movements: ____________ Date: ____________ Start time:  ____________ Stop time: ____________ Movements: ____________ Date: ____________ Start time: ____________ Stop time: ____________ Movements: ____________ Date: ____________ Start time: ____________ Stop time: ____________ Movements: ____________ Date: ____________ Start time: ____________ Stop time: ____________ Movements: ____________ Date: ____________ Start time: ____________ Stop time: ____________ Movements: ____________ Date: ____________ Start time: ____________ Stop time: ____________ Movements: ____________ This information is not intended to replace advice given to you by your health care provider. Make sure you discuss any questions you have with your health care provider. Document Released: 08/24/2006 Document Revised: 08/14/2018 Document Reviewed: 09/03/2015 Elsevier Patient Education  [place]. Iron-Rich Diet  Iron is a mineral that helps your body to produce hemoglobin. Hemoglobin is a protein in red blood cells that carries oxygen to your body's tissues. Eating too little iron may cause you to feel weak and tired, and it can increase your risk of infection. Iron is naturally found in many foods, and many foods have iron added to them (iron-fortified foods). You may need to follow an iron-rich diet if you do not have enough iron in your body due to certain medical conditions. The amount of iron that you need each day depends on your age, your sex, and any medical conditions you have. Follow instructions from your health care provider or a diet and nutrition specialist (dietitian) about how much iron you should eat each day. What are tips for following this plan? Reading food labels  Check food labels to see how many milligrams (mg) of iron are in each serving. Cooking  Cook foods in pots and pans that are made  from iron.  Take these steps to make it easier for your body to absorb iron from certain foods: ? Soak beans overnight before cooking. ? Soak whole grains  overnight and drain them before using. ? Ferment flours before baking, such as by using yeast in bread dough. Meal planning  When you eat foods that contain iron, you should eat them with foods that are high in vitamin C. These include oranges, peppers, tomatoes, potatoes, and mango. Vitamin C helps your body to absorb iron. General information  Take iron supplements only as told by your health care provider. An overdose of iron can be life-threatening. If you were prescribed iron supplements, take them with orange juice or a vitamin C supplement.  When you eat iron-fortified foods or take an iron supplement, you should also eat foods that naturally contain iron, such as meat, poultry, and fish. Eating naturally iron-rich foods helps your body to absorb the iron that is added to other foods or contained in a supplement.  Certain foods and drinks prevent your body from absorbing iron properly. Avoid eating these foods in the same meal as iron-rich foods or with iron supplements. These foods include: ? Coffee, black tea, and red wine. ? Milk, dairy products, and foods that are high in calcium. ? Beans and soybeans. ? Whole grains. What foods should I eat? Fruits Prunes. Raisins. Eat fruits high in vitamin C, such as oranges, grapefruits, and strawberries, alongside iron-rich foods. Vegetables Spinach (cooked). Green peas. Broccoli. Fermented vegetables. Eat vegetables high in vitamin C, such as leafy greens, potatoes, bell peppers, and tomatoes, alongside iron-rich foods. Grains Iron-fortified breakfast cereal. Iron-fortified whole-wheat bread. Enriched rice. Sprouted grains. Meats and other proteins Beef liver. Oysters. Beef. Shrimp. [place]. Chicken. [place]. Sardines. Chickpeas. Nuts. Tofu. Pumpkin seeds. Beverages Tomato juice. Fresh orange juice. Prune juice. Hibiscus tea. Fortified instant breakfast shakes. Sweets and desserts Blackstrap molasses. Seasonings and condiments Tahini.  Fermented soy sauce. Other foods Wheat germ. The items listed above may not be a complete list of recommended foods and beverages. Contact a dietitian for more information. What foods should I avoid? Grains Whole grains. Bran cereal. Bran flour. Oats. Meats and other proteins Soybeans. Products made from soy protein. Black beans. Lentils. Mung beans. Split peas. Dairy Milk. Cream. Cheese. Yogurt. Cottage cheese. Beverages Coffee. Black tea. Red wine. Sweets and desserts Cocoa. Chocolate. Ice cream. Other foods Basil. Oregano. Large amounts of parsley. The items listed above may not be a complete list of foods and beverages to avoid. Contact a dietitian for more information. Summary  Iron is a mineral that helps your body to produce hemoglobin. Hemoglobin is a protein in red blood cells that carries oxygen to your body's tissues.  Iron is naturally found in many foods, and many foods have iron added to them (iron-fortified foods).  When you eat foods that contain iron, you should eat them with foods that are high in vitamin C. Vitamin C helps your body to absorb iron.  Certain foods and drinks prevent your body from absorbing iron properly, such as whole grains and dairy products. You should avoid eating these foods in the same meal as iron-rich foods or with iron supplements. This information is not intended to replace advice given to you by your health care provider. Make sure you discuss any questions you have with your health care provider. Document Released: 03/08/2005 Document Revised: 07/07/2017 Document Reviewed: 06/20/2017 Elsevier Patient Education  2020 [employer].

## 2019-04-04 ENCOUNTER — Encounter: Payer: Self-pay | Admitting: Obstetrics and Gynecology

## 2019-04-04 ENCOUNTER — Other Ambulatory Visit: Payer: Self-pay | Admitting: Obstetrics and Gynecology

## 2019-04-04 DIAGNOSIS — O99019 Anemia complicating pregnancy, unspecified trimester: Secondary | ICD-10-CM | POA: Insufficient documentation

## 2019-04-04 LAB — GLUCOSE TOLERANCE, 2 HOURS W/ 1HR
Glucose, 1 hour: 111 mg/dL (ref 65–179)
Glucose, 2 hour: 100 mg/dL (ref 65–152)
Glucose, Fasting: 65 mg/dL (ref 65–91)

## 2019-04-04 LAB — CBC
Hematocrit: 30.7 % — ABNORMAL LOW (ref 34.0–46.6)
Hemoglobin: 10.1 g/dL — ABNORMAL LOW (ref 11.1–15.9)
MCH: 29.1 pg (ref 26.6–33.0)
MCHC: 32.9 g/dL (ref 31.5–35.7)
MCV: 89 fL (ref 79–97)
Platelets: 195 10*3/uL (ref 150–450)
RBC: 3.47 x10E6/uL — ABNORMAL LOW (ref 3.77–5.28)
RDW: 12.9 % (ref 11.7–15.4)
WBC: 10.5 10*3/uL (ref 3.4–10.8)

## 2019-04-04 LAB — HIV ANTIBODY (ROUTINE TESTING W REFLEX): HIV Screen 4th Generation wRfx: NONREACTIVE

## 2019-04-04 LAB — RPR: RPR Ser Ql: NONREACTIVE

## 2019-04-04 MED ORDER — FERROUS SULFATE 325 (65 FE) MG PO TBEC: 325.0000 mg | DELAYED_RELEASE_TABLET | Freq: Two times a day (BID) | ORAL | 2 refills | Status: DC

## 2019-04-04 NOTE — Progress Notes (Signed)
FeSO4 supplement Rx sent

## 2019-04-05 ENCOUNTER — Encounter: Payer: Self-pay | Admitting: General Practice

## 2019-04-09 ENCOUNTER — Encounter: Payer: Self-pay | Admitting: General Practice

## 2019-05-09 ENCOUNTER — Telehealth (INDEPENDENT_AMBULATORY_CARE_PROVIDER_SITE_OTHER): Payer: Medicaid Other | Admitting: Obstetrics and Gynecology

## 2019-05-09 ENCOUNTER — Encounter: Payer: Self-pay | Admitting: Obstetrics and Gynecology

## 2019-05-09 ENCOUNTER — Telehealth: Payer: Medicaid Other | Admitting: Obstetrics and Gynecology

## 2019-05-09 DIAGNOSIS — O26892 Other specified pregnancy related conditions, second trimester: Secondary | ICD-10-CM

## 2019-05-09 DIAGNOSIS — Z3400 Encounter for supervision of normal first pregnancy, unspecified trimester: Secondary | ICD-10-CM

## 2019-05-09 DIAGNOSIS — N898 Other specified noninflammatory disorders of vagina: Secondary | ICD-10-CM

## 2019-05-09 DIAGNOSIS — O26893 Other specified pregnancy related conditions, third trimester: Secondary | ICD-10-CM

## 2019-05-09 DIAGNOSIS — O99019 Anemia complicating pregnancy, unspecified trimester: Secondary | ICD-10-CM

## 2019-05-09 DIAGNOSIS — Z3A32 32 weeks gestation of pregnancy: Secondary | ICD-10-CM

## 2019-05-09 DIAGNOSIS — O99013 Anemia complicating pregnancy, third trimester: Secondary | ICD-10-CM

## 2019-05-09 NOTE — Progress Notes (Signed)
   MY CHART VIDEO VIRTUAL OBSTETRICS VISIT ENCOUNTER NOTE  I connected with Ashlee Johnson on 05/09/19 at 10:10 AM EDT by My Chart video at work and verified that I am speaking with the correct person using two identifiers.   I discussed the limitations, risks, security and privacy concerns of performing an evaluation and management service by My Chart video and the availability of in person appointments. I also discussed with the patient that there may be a patient responsible charge related to this service. The patient expressed understanding and agreed to proceed.  Subjective:  Ashlee Johnson is a 23 y.o. G2P0010 at [redacted]w[redacted]d being followed for ongoing prenatal care.  She is currently monitored for the following issues for this low-risk pregnancy and has Supervision of normal first pregnancy, antepartum; Vaginal discharge during pregnancy in second trimester; and Anemia during pregnancy on their problem list.  Patient reports no complaints. She wants to know when she can have another ultrasound, because she has only had 2 during this pregnancy. Reports fetal movement. Denies any contractions, bleeding or leaking of fluid.   The following portions of the patient's history were reviewed and updated as appropriate: allergies, current medications, past family history, past medical history, past social history, past surgical history and problem list.   Objective:   General:  Alert, oriented and cooperative.   Mental Status: Normal mood and affect perceived. Normal judgment and thought content.  Rest of physical exam deferred due to type of encounter  BP 118/87   Pulse 78   Wt 153 lb (69.4 kg)   LMP 09/21/2018 (Exact Date)   BMI 26.26 kg/m  **Done by patient's own at home BP cuff and scale  Assessment and Plan:  Pregnancy: G2P0010 at [redacted]w[redacted]d  1. Anemia during pregnancy - taking iron supplements  2. Vaginal discharge during pregnancy in second trimester - not a new problem, no complaints   3. Supervision of normal first pregnancy, antepartum - Anticipatory guidance for GBS and cx check nv - Advised that insurances only allow up to 2 ultrasounds in a normal low-risk pregnancy. Advised will order another ultrasound IF a medical indication makes one necessary. Offered for patient to go to a local, private commercial ultrasound business for another ultrasound, if she desires.  Preterm labor symptoms and general obstetric precautions including but not limited to vaginal bleeding, contractions, leaking of fluid and fetal movement were reviewed in detail with the patient.  I discussed the assessment and treatment plan with the patient. The patient was provided an opportunity to ask questions and all were answered. The patient agreed with the plan and demonstrated an understanding of the instructions. The patient was advised to call back or seek an in-person office evaluation/go to [facility] at [facility] for any urgent or concerning symptoms. Please refer to After Visit Summary for other counseling recommendations.   I provided 5 minutes of non-face-to-face time during this encounter. There was 5 minutes of chart review time spent prior to this encounter. Total time spent = 10 minutes.   Future Appointments  Date Time Provider [place]  06/06/2019  1:50 PM Laury Deep, CNM [facility] None    Laury Deep, [place] for [employer], [place]

## 2019-06-06 ENCOUNTER — Ambulatory Visit (INDEPENDENT_AMBULATORY_CARE_PROVIDER_SITE_OTHER): Payer: Medicaid Other | Admitting: Obstetrics and Gynecology

## 2019-06-06 ENCOUNTER — Other Ambulatory Visit (HOSPITAL_COMMUNITY)
Admission: RE | Admit: 2019-06-06 | Discharge: 2019-06-06 | Disposition: A | Discharge status: Home / Self Care | Payer: Medicaid Other | Source: Ambulatory Visit | Attending: Obstetrics and Gynecology | Admitting: Obstetrics and Gynecology

## 2019-06-06 ENCOUNTER — Encounter: Payer: Self-pay | Admitting: Obstetrics and Gynecology

## 2019-06-06 ENCOUNTER — Other Ambulatory Visit: Payer: Self-pay

## 2019-06-06 VITALS — BP 129/84 | HR 76 | Temp 98.2°F | Wt 164.6 lb

## 2019-06-06 DIAGNOSIS — Z3400 Encounter for supervision of normal first pregnancy, unspecified trimester: Secondary | ICD-10-CM | POA: Diagnosis not present

## 2019-06-06 DIAGNOSIS — Z3403 Encounter for supervision of normal first pregnancy, third trimester: Secondary | ICD-10-CM

## 2019-06-06 DIAGNOSIS — Z3A36 36 weeks gestation of pregnancy: Secondary | ICD-10-CM

## 2019-06-06 NOTE — Progress Notes (Signed)
     LOW-RISK PREGNANCY OFFICE VISIT Patient name: Ashlee Johnson MRN [id number]  Date of birth: Apr 25, 1996 Chief Complaint:   Routine Prenatal Visit  History of Present Illness:   Ashlee Johnson is a 23 y.o. G78P0010 female at [redacted]w[redacted]d with an Estimated Date of Delivery: 06/28/19 being seen today for ongoing management of a low-risk pregnancy.  Today she reports occasional contractions and pelvic pressure and swelling in ankles at the end of day after leaving work. Contractions: Irregular. Vag. Bleeding: None.  Movement: Present. denies leaking of fluid. Review of Systems:   Pertinent items are noted in HPI Denies abnormal vaginal discharge w/ itching/odor/irritation, headaches, visual changes, shortness of breath, chest pain, abdominal pain, severe nausea/vomiting, or problems with urination or bowel movements unless otherwise stated above. Pertinent History Reviewed:  Reviewed past medical,surgical, social, obstetrical and family history.  Reviewed problem list, medications and allergies. Physical Assessment:   Vitals:   06/06/19 1358  BP: 129/84  Pulse: 76  Temp: 98.2 F (36.8 C)  Weight: 164 lb 9.6 oz (74.7 kg)  Body mass index is 28.25 kg/m.        Physical Examination:   General appearance: Well appearing, and in no distress  Mental status: Alert, oriented to person, place, and time  Skin: Warm & dry  Cardiovascular: Normal heart rate noted  Respiratory: Normal respiratory effort, no distress  Abdomen: Soft, gravid, nontender  Pelvic: Cervical exam performed  Dilation: 1.5 Effacement (%): 60 Station: -3  Extremities: Edema: Mild pitting, slight indentation  Fetal Status: Fetal Heart Rate (bpm): 143 Fundal Height: 34 cm Movement: Present Presentation: Vertex   Assessment & Plan:  1) Low-risk pregnancy G2P0010 at [redacted]w[redacted]d with an Estimated Date of Delivery: 06/28/19   2) Supervision of normal first pregnancy, antepartum  - Plan: Culture, beta strep (group b only),  -  Cervicovaginal ancillary only( ) - RTO next week for cervical check    Meds: No orders of the defined types were placed in this encounter.  Labs/procedures today: GBS, GC/CT  Plan:  Continue routine obstetrical care   Reviewed: Preterm labor symptoms and general obstetric precautions including but not limited to vaginal bleeding, contractions, leaking of fluid and fetal movement were reviewed in detail with the patient.  All questions were answered. Has home bp cuff. Check bp weekly, let us know if >140/90.   Follow-up: Return in about 1 week (around 06/13/2019) for Return OB visit - cervical check.  Orders Placed This Encounter  Procedures  . Culture, beta strep (group b only)   Laury Deep MSN, CNM 06/06/2019 2:17 PM

## 2019-06-07 LAB — CERVICOVAGINAL ANCILLARY ONLY
Bacterial Vaginitis (gardnerella): NEGATIVE
Candida Glabrata: NEGATIVE
Candida Vaginitis: NEGATIVE
Chlamydia: NEGATIVE
Comment: NEGATIVE
Comment: NEGATIVE
Comment: NEGATIVE
Comment: NEGATIVE
Comment: NEGATIVE
Comment: NORMAL
Neisseria Gonorrhea: NEGATIVE
Trichomonas: NEGATIVE

## 2019-06-10 LAB — CULTURE, BETA STREP (GROUP B ONLY): Strep Gp B Culture: NEGATIVE

## 2019-06-14 ENCOUNTER — Ambulatory Visit (INDEPENDENT_AMBULATORY_CARE_PROVIDER_SITE_OTHER): Payer: Medicaid Other | Admitting: Advanced Practice Midwife

## 2019-06-14 ENCOUNTER — Encounter: Payer: Self-pay | Admitting: Advanced Practice Midwife

## 2019-06-14 ENCOUNTER — Other Ambulatory Visit: Payer: Self-pay

## 2019-06-14 VITALS — BP 136/88 | HR 102 | Temp 98.3°F | Wt 164.8 lb

## 2019-06-14 DIAGNOSIS — Z3480 Encounter for supervision of other normal pregnancy, unspecified trimester: Secondary | ICD-10-CM

## 2019-06-14 DIAGNOSIS — Z3483 Encounter for supervision of other normal pregnancy, third trimester: Secondary | ICD-10-CM

## 2019-06-14 DIAGNOSIS — Z3A38 38 weeks gestation of pregnancy: Secondary | ICD-10-CM

## 2019-06-14 NOTE — Progress Notes (Signed)
   PRENATAL VISIT NOTE  Subjective:  Ashlee Johnson is a 23 y.o. G2P0010 at [redacted]w[redacted]d being seen today for ongoing prenatal care.  She is currently monitored for the following issues for this low-risk pregnancy and has Supervision of normal first pregnancy, antepartum; Vaginal discharge during pregnancy in second trimester; and Anemia during pregnancy on their problem list.  Patient reports occasional contractions.  Contractions: Irregular. Vag. Bleeding: None.  Movement: Present. Denies leaking of fluid. Patient reports that she has had contractions off and on for about one week. She states that she will have several contractions in a row and then not any for about an hour.   The following portions of the patient's history were reviewed and updated as appropriate: allergies, current medications, past family history, past medical history, past social history, past surgical history and problem list.   Objective:   Vitals:   06/14/19 0906  BP: 136/88  Pulse: (!) 102  Temp: 98.3 F (36.8 C)  Weight: 164 lb 12.8 oz (74.8 kg)    Fetal Status: Fetal Heart Rate (bpm): 142 Fundal Height: 37 cm Movement: Present  Presentation: Vertex  General:  Alert, oriented and cooperative. Patient is in no acute distress.  Skin: Skin is warm and dry. No rash noted.   Cardiovascular: Normal heart rate noted  Respiratory: Normal respiratory effort, no problems with respiration noted  Abdomen: Soft, gravid, appropriate for gestational age.  Pain/Pressure: Present     Pelvic: Cervical exam performed Dilation: 1.5 Effacement (%): 60 Station: -1  Extremities: Normal range of motion.     Mental Status: Normal mood and affect. Normal behavior. Normal judgment and thought content.   Assessment and Plan:  Pregnancy: G2P0010 at [redacted]w[redacted]d 1. Supervision of other normal pregnancy, antepartum - Routine care  Term labor symptoms and general obstetric precautions including but not limited to vaginal bleeding, contractions,  leaking of fluid and fetal movement were reviewed in detail with the patient. Please refer to After Visit Summary for other counseling recommendations.   Return in about 1 week (around 06/21/2019) for in person .  No future appointments.  Marcille Buffy DNP, CNM  06/14/19  9:29 AM

## 2019-06-16 ENCOUNTER — Encounter (HOSPITAL_COMMUNITY): Payer: Self-pay | Admitting: *Deleted

## 2019-06-16 ENCOUNTER — Inpatient Hospital Stay (HOSPITAL_BASED_OUTPATIENT_CLINIC_OR_DEPARTMENT_OTHER): Payer: Medicaid Other

## 2019-06-16 ENCOUNTER — Inpatient Hospital Stay (EMERGENCY_DEPARTMENT_HOSPITAL)
Admission: AD | Admit: 2019-06-16 | Discharge: 2019-06-17 | Disposition: A | Discharge status: Home / Self Care | Payer: Medicaid Other | Source: Home / Self Care | Attending: Obstetrics & Gynecology | Admitting: Obstetrics & Gynecology

## 2019-06-16 DIAGNOSIS — O368130 Decreased fetal movements, third trimester, not applicable or unspecified: Secondary | ICD-10-CM

## 2019-06-16 DIAGNOSIS — O368131 Decreased fetal movements, third trimester, fetus 1: Secondary | ICD-10-CM | POA: Diagnosis not present

## 2019-06-16 DIAGNOSIS — Z3A38 38 weeks gestation of pregnancy: Secondary | ICD-10-CM

## 2019-06-16 DIAGNOSIS — O368190 Decreased fetal movements, unspecified trimester, not applicable or unspecified: Secondary | ICD-10-CM

## 2019-06-16 DIAGNOSIS — O471 False labor at or after 37 completed weeks of gestation: Secondary | ICD-10-CM

## 2019-06-16 DIAGNOSIS — Z885 Allergy status to narcotic agent status: Secondary | ICD-10-CM | POA: Insufficient documentation

## 2019-06-16 NOTE — MAU Note (Signed)
Patient presents to MAU c/o DFM and CTX.  Patient reports last fetal movement felt last night before she went to sleep.  Patient reports CTX q5-7 mins, patient reports ctx become more intense around 2030. Patient denies LOF or vaginal bleeding. Patient reports loss of mucus plug today.

## 2019-06-16 NOTE — MAU Provider Note (Signed)
History     CSN: [id number]  Arrival date and time: 06/16/19 2230   First Provider Initiated Contact with Patient 06/16/19 2253      Chief Complaint  Patient presents with  . Contractions  . Decreased Fetal Movement   23 yo G2P0010 at 38.3 EGA presenting to MAU for decreased fetal movement. She last felt her baby move before bedtime last night. She has not felt her baby move at all today. She has not done kick count.  She also reports contractions that started early this morning. They have started coming closer together, and became more intense around 2030 hours. She denies any vaginal bleeding, but does endorse a large amount of mucous discharge sometime this afternoon. She reports she does not drink a lot of water.   OB History    Gravida  2   Para      Term      Preterm      AB  1   Living        SAB  1   TAB      Ectopic      Multiple      Live Births              Past Medical History:  Diagnosis Date  . Medical history non-contributory     Past Surgical History:  Procedure Laterality Date  . NO PAST SURGERIES      Family History  Problem Relation Age of Onset  . Heart disease Father   . Cancer Father     Social History   Tobacco Use  . Smoking status: Never Smoker  . Smokeless tobacco: Never Used  Substance Use Topics  . Alcohol use: Not Currently    Frequency: Never  . Drug use: Never    Allergies:  Allergies  Allergen Reactions  . Percocet [Oxycodone-Acetaminophen] Hives and Nausea And Vomiting    Medications Prior to Admission  Medication Sig Dispense Refill Last Dose  . Prenatal Vit-Fe Phos-FA-Omega (VITAFOL GUMMIES) 3.33-0.333-34.8 MG CHEW Chew 3 each by mouth daily. 90 tablet 12 Past Month at Unknown time  . Blood Pressure Monitoring (BLOOD PRESSURE MONITOR AUTOMAT) DEVI Appropriate size cuff (forearm circumference) with automatic BP monitor. To be monitored regularly at home. ICD-10 code: Z34.80, Supervision of other  normal pregnancy. 1 Device 0   . famotidine (PEPCID) 20 MG tablet Take 1 tablet (20 mg total) by mouth 2 (two) times daily. 60 tablet 2   . ferrous sulfate 325 (65 FE) MG EC tablet Take 1 tablet (325 mg total) by mouth 2 (two) times daily with a meal. 60 tablet 2   . terconazole (TERAZOL 7) 0.4 % vaginal cream Place 1 applicator vaginally at bedtime. Use for seven days 45 g 0     Review of Systems  All other systems reviewed and are negative.  Physical Exam   Last menstrual period 09/21/2018.  Physical Exam  Nursing note and vitals reviewed. Constitutional: She is oriented to person, place, and time. She appears well-developed and well-nourished.  HENT:  Head: Normocephalic and atraumatic.  Eyes: Pupils are equal, round, and reactive to light. Conjunctivae and EOM are normal.  Neck: Normal range of motion. Neck supple.  Cardiovascular: Normal rate, regular rhythm, normal heart sounds and intact distal pulses.  Respiratory: Effort normal and breath sounds normal.  GI: Soft.  Gravid, Vertex by [employer]  Musculoskeletal: Normal range of motion.  Neurological: She is alert and oriented to person, place, and time. She  has normal reflexes.  Skin: Skin is warm and dry.  Psychiatric: She has a normal mood and affect. Her behavior is normal. Judgment and thought content normal.    MAU Course  Procedures  MDM -NST reactive: 150 bpm, moderate variability, ++ accels 15x15 -BPP ordered  BPP 8/8, w/ NST 10/10 No cervical change after 2 hours  Assessment and Plan  23 yo G2P0010 at 38.3 EGA presenting for decreased fetal movement -BPP 8/8 -NST reactive -no cervical change over 2 hours -Labor precautions given -DC to home with routine follow up this week  [employer] L Pierra Skora 06/16/2019, 11:24 PM

## 2019-06-17 DIAGNOSIS — O368130 Decreased fetal movements, third trimester, not applicable or unspecified: Secondary | ICD-10-CM

## 2019-06-17 DIAGNOSIS — Z3A38 38 weeks gestation of pregnancy: Secondary | ICD-10-CM

## 2019-06-17 DIAGNOSIS — O4703 False labor before 37 completed weeks of gestation, third trimester: Secondary | ICD-10-CM

## 2019-06-17 MED ORDER — ACETAMINOPHEN 325 MG PO TABS
650.0000 mg | ORAL_TABLET | Freq: Four times a day (QID) | ORAL | Status: DC | PRN
  Administered 2019-06-17: 650 mg via ORAL
  Filled 2019-06-17: qty 2

## 2019-06-17 MED ORDER — ACETAMINOPHEN 325 MG PO TABS: 650.0000 mg | ORAL_TABLET | Freq: Four times a day (QID) | ORAL | 0 refills | Status: DC | PRN

## 2019-06-17 NOTE — Discharge Instructions (Signed)
Signs and Symptoms of Labor Labor is your body's natural process of moving your baby, placenta, and umbilical cord out of your uterus. The process of labor usually starts when your baby is full-term, between 37 and 40 weeks of pregnancy. How will I know when I am close to going into labor? As your body prepares for labor and the birth of your baby, you may notice the following symptoms in the weeks and days before true labor starts:  Having a strong desire to get your home ready to receive your new baby. This is called nesting. Nesting may be a sign that labor is approaching, and it may occur several weeks before birth. Nesting may involve cleaning and organizing your home.  Passing a small amount of thick, bloody mucus out of your vagina (normal bloody show or losing your mucus plug). This may happen more than a week before labor begins, or it might occur right before labor begins as the opening of the cervix starts to widen (dilate). For some women, the entire mucus plug passes at once. For others, smaller portions of the mucus plug may gradually pass over several days.  Your baby moving (dropping) lower in your pelvis to get into position for birth (lightening). When this happens, you may feel more pressure on your bladder and pelvic bone and less pressure on your ribs. This may make it easier to breathe. It may also cause you to need to urinate more often and have problems with bowel movements.  Having "practice contractions" (Braxton Hicks contractions) that occur at irregular (unevenly spaced) intervals that are more than 10 minutes apart. This is also called false labor. False labor contractions are common after exercise or sexual activity, and they will stop if you change position, rest, or drink fluids. These contractions are usually mild and do not get stronger over time. They may feel like: ? A backache or back pain. ? Mild cramps, similar to menstrual cramps. ? Tightening or pressure in  your abdomen. Other early symptoms that labor may be starting soon include:  Nausea or loss of appetite.  Diarrhea.  Having a sudden burst of energy, or feeling very tired.  Mood changes.  Having trouble sleeping. How will I know when labor has begun? Signs that true labor has begun may include:  Having contractions that come at regular (evenly spaced) intervals and increase in intensity. This may feel like more intense tightening or pressure in your abdomen that moves to your back. ? Contractions may also feel like rhythmic pain in your upper thighs or back that comes and goes at regular intervals. ? For first-time mothers, this change in intensity of contractions often occurs at a more gradual pace. ? Women who have given birth before may notice a more rapid progression of contraction changes.  Having a feeling of pressure in the vaginal area.  Your water breaking (rupture of membranes). This is when the sac of fluid that surrounds your baby breaks. When this happens, you will notice fluid leaking from your vagina. This may be clear or blood-tinged. Labor usually starts within 24 hours of your water breaking, but it may take longer to begin. ? Some women notice this as a gush of fluid. ? Others notice that their underwear repeatedly becomes damp. Follow these instructions at home:   When labor starts, or if your water breaks, call your health care provider or nurse care line. Based on your situation, they will determine when you should go in for an   exam.  When you are in early labor, you may be able to rest and manage symptoms at home. Some strategies to try at home include: ? Breathing and relaxation techniques. ? Taking a warm bath or shower. ? Listening to music. ? Using a heating pad on the lower back for pain. If you are directed to use heat:  Place a towel between your skin and the heat source.  Leave the heat on for 20-30 minutes.  Remove the heat if your skin turns  bright red. This is especially important if you are unable to feel pain, heat, or cold. You may have a greater risk of getting burned. Get help right away if:  You have painful, regular contractions that are 5 minutes apart or less.  Labor starts before you are [redacted] weeks along in your pregnancy.  You have a fever.  You have a headache that does not go away.  You have bright red blood coming from your vagina.  You do not feel your baby moving.  You have a sudden onset of: ? Severe headache with vision problems. ? Nausea, vomiting, or diarrhea. ? Chest pain or shortness of breath. These symptoms may be an emergency. If your health care provider recommends that you go to the hospital or birth center where you plan to deliver, do not drive yourself. Have someone else drive you, or call emergency services (911 in the [place] Summary  Labor is your body's natural process of moving your baby, placenta, and umbilical cord out of your uterus.  The process of labor usually starts when your baby is full-term, between 37 and 40 weeks of pregnancy.  When labor starts, or if your water breaks, call your health care provider or nurse care line. Based on your situation, they will determine when you should go in for an exam. This information is not intended to replace advice given to you by your health care provider. Make sure you discuss any questions you have with your health care provider. Document Released: 12/30/2016 Document Revised: 04/24/2017 Document Reviewed: 12/30/2016 Elsevier Patient Education  2020 Elsevier Inc.  Fetal Movement Counts Patient Name: ________________________________________________ Patient Due Date: ____________________ What is a fetal movement count?  A fetal movement count is the number of times that you feel your baby move during a certain amount of time. This may also be called a fetal kick count. A fetal movement count is recommended for every pregnant woman. You may  be asked to start counting fetal movements as early as week 28 of your pregnancy. Pay attention to when your baby is most active. You may notice your baby's sleep and wake cycles. You may also notice things that make your baby move more. You should do a fetal movement count:  When your baby is normally most active.  At the same time each day. A good time to count movements is while you are resting, after having something to eat and drink. How do I count fetal movements? 1. Find a quiet, comfortable area. Sit, or lie down on your side. 2. Write down the date, the start time and stop time, and the number of movements that you felt between those two times. Take this information with you to your health care visits. 3. For 2 hours, count kicks, flutters, swishes, rolls, and jabs. You should feel at least 10 movements during 2 hours. 4. You may stop counting after you have felt 10 movements. 5. If you do not feel 10 movements in 2   hours, have something to eat and drink. Then, keep resting and counting for 1 hour. If you feel at least 4 movements during that hour, you may stop counting. Contact a health care provider if:  You feel fewer than 4 movements in 2 hours.  Your baby is not moving like he or she usually does. Date: ____________ Start time: ____________ Stop time: ____________ Movements: ____________ Date: ____________ Start time: ____________ Stop time: ____________ Movements: ____________ Date: ____________ Start time: ____________ Stop time: ____________ Movements: ____________ Date: ____________ Start time: ____________ Stop time: ____________ Movements: ____________ Date: ____________ Start time: ____________ Stop time: ____________ Movements: ____________ Date: ____________ Start time: ____________ Stop time: ____________ Movements: ____________ Date: ____________ Start time: ____________ Stop time: ____________ Movements: ____________ Date: ____________ Start time: ____________ Stop  time: ____________ Movements: ____________ Date: ____________ Start time: ____________ Stop time: ____________ Movements: ____________ This information is not intended to replace advice given to you by your health care provider. Make sure you discuss any questions you have with your health care provider. Document Released: 08/24/2006 Document Revised: 08/14/2018 Document Reviewed: 09/03/2015 Elsevier Patient Education  2020 Elsevier Inc.  

## 2019-06-18 ENCOUNTER — Encounter (HOSPITAL_COMMUNITY): Payer: Self-pay

## 2019-06-18 ENCOUNTER — Inpatient Hospital Stay (HOSPITAL_COMMUNITY): Payer: Medicaid Other | Admitting: Anesthesiology

## 2019-06-18 ENCOUNTER — Inpatient Hospital Stay (HOSPITAL_COMMUNITY)
Admission: AD | Admit: 2019-06-18 | Discharge: 2019-06-20 | DRG: 807 | Disposition: A | Discharge status: Home / Self Care | Payer: Medicaid Other | Attending: Family Medicine | Admitting: Family Medicine

## 2019-06-18 ENCOUNTER — Other Ambulatory Visit: Payer: Self-pay

## 2019-06-18 DIAGNOSIS — D649 Anemia, unspecified: Secondary | ICD-10-CM | POA: Diagnosis not present

## 2019-06-18 DIAGNOSIS — Z3A38 38 weeks gestation of pregnancy: Secondary | ICD-10-CM | POA: Diagnosis not present

## 2019-06-18 DIAGNOSIS — O99019 Anemia complicating pregnancy, unspecified trimester: Secondary | ICD-10-CM

## 2019-06-18 DIAGNOSIS — O26893 Other specified pregnancy related conditions, third trimester: Secondary | ICD-10-CM | POA: Diagnosis present

## 2019-06-18 DIAGNOSIS — Z20828 Contact with and (suspected) exposure to other viral communicable diseases: Secondary | ICD-10-CM | POA: Diagnosis present

## 2019-06-18 DIAGNOSIS — O4703 False labor before 37 completed weeks of gestation, third trimester: Secondary | ICD-10-CM | POA: Diagnosis not present

## 2019-06-18 DIAGNOSIS — O9902 Anemia complicating childbirth: Principal | ICD-10-CM | POA: Diagnosis present

## 2019-06-18 DIAGNOSIS — N898 Other specified noninflammatory disorders of vagina: Secondary | ICD-10-CM

## 2019-06-18 DIAGNOSIS — Z3400 Encounter for supervision of normal first pregnancy, unspecified trimester: Secondary | ICD-10-CM

## 2019-06-18 DIAGNOSIS — O368130 Decreased fetal movements, third trimester, not applicable or unspecified: Secondary | ICD-10-CM | POA: Diagnosis not present

## 2019-06-18 HISTORY — DX: Cardiac murmur, unspecified: R01.1

## 2019-06-18 LAB — CBC
HCT: 35.5 % — ABNORMAL LOW (ref 36.0–46.0)
Hemoglobin: 11.1 g/dL — ABNORMAL LOW (ref 12.0–15.0)
MCH: 28.5 pg (ref 26.0–34.0)
MCHC: 31.3 g/dL (ref 30.0–36.0)
MCV: 91 fL (ref 80.0–100.0)
Platelets: 235 10*3/uL (ref 150–400)
RBC: 3.9 MIL/uL (ref 3.87–5.11)
RDW: 14.4 % (ref 11.5–15.5)
WBC: 15.3 10*3/uL — ABNORMAL HIGH (ref 4.0–10.5)
nRBC: 0 % (ref 0.0–0.2)

## 2019-06-18 LAB — ABO/RH: ABO/RH(D): A POS

## 2019-06-18 LAB — SARS CORONAVIRUS 2 BY RT PCR (HOSPITAL ORDER, PERFORMED IN ~~LOC~~ HOSPITAL LAB): SARS Coronavirus 2: NEGATIVE

## 2019-06-18 LAB — RPR: RPR Ser Ql: NONREACTIVE

## 2019-06-18 LAB — TYPE AND SCREEN
ABO/RH(D): A POS
Antibody Screen: NEGATIVE

## 2019-06-18 MED ORDER — OXYTOCIN 40 UNITS IN NORMAL SALINE INFUSION - SIMPLE MED
2.5000 [IU]/h | INTRAVENOUS | Status: DC
  Administered 2019-06-18: 2.5 [IU]/h via INTRAVENOUS
  Filled 2019-06-18: qty 1000

## 2019-06-18 MED ORDER — EPHEDRINE 5 MG/ML INJ: 10.0000 mg | INTRAVENOUS | Status: DC | PRN

## 2019-06-18 MED ORDER — OXYCODONE-ACETAMINOPHEN 5-325 MG PO TABS: 1.0000 | ORAL_TABLET | ORAL | Status: DC | PRN

## 2019-06-18 MED ORDER — FLEET ENEMA 7-19 GM/118ML RE ENEM: 1.0000 | ENEMA | RECTAL | Status: DC | PRN

## 2019-06-18 MED ORDER — DIPHENHYDRAMINE HCL 50 MG/ML IJ SOLN: 12.5000 mg | INTRAMUSCULAR | Status: DC | PRN

## 2019-06-18 MED ORDER — OXYTOCIN BOLUS FROM INFUSION
500.0000 mL | Freq: Once | INTRAVENOUS | Status: AC
  Administered 2019-06-18: 18:00:00 500 mL via INTRAVENOUS

## 2019-06-18 MED ORDER — LACTATED RINGERS IV SOLN
INTRAVENOUS | Status: DC
  Administered 2019-06-18: 07:00:00 via INTRAVENOUS

## 2019-06-18 MED ORDER — DIPHENHYDRAMINE HCL 25 MG PO CAPS: 25.0000 mg | ORAL_CAPSULE | Freq: Four times a day (QID) | ORAL | Status: DC | PRN

## 2019-06-18 MED ORDER — FENTANYL CITRATE (PF) 100 MCG/2ML IJ SOLN
100.0000 ug | INTRAMUSCULAR | Status: DC | PRN
  Administered 2019-06-18: 100 ug via INTRAVENOUS
  Filled 2019-06-18: qty 2

## 2019-06-18 MED ORDER — OXYCODONE-ACETAMINOPHEN 5-325 MG PO TABS: 2.0000 | ORAL_TABLET | ORAL | Status: DC | PRN

## 2019-06-18 MED ORDER — IBUPROFEN 600 MG PO TABS
600.0000 mg | ORAL_TABLET | Freq: Four times a day (QID) | ORAL | Status: DC
  Administered 2019-06-19 – 2019-06-20 (×6): 600 mg via ORAL
  Filled 2019-06-18 (×6): qty 1

## 2019-06-18 MED ORDER — LIDOCAINE HCL (PF) 1 % IJ SOLN
30.0000 mL | INTRAMUSCULAR | Status: DC | PRN
  Administered 2019-06-18: 30 mL via SUBCUTANEOUS
  Filled 2019-06-18: qty 30

## 2019-06-18 MED ORDER — FENTANYL-BUPIVACAINE-NACL 0.5-0.125-0.9 MG/250ML-% EP SOLN: 12.0000 mL/h | EPIDURAL | Status: DC | PRN

## 2019-06-18 MED ORDER — ONDANSETRON HCL 4 MG/2ML IJ SOLN: 4.0000 mg | INTRAMUSCULAR | Status: DC | PRN

## 2019-06-18 MED ORDER — TETANUS-DIPHTH-ACELL PERTUSSIS 5-2.5-18.5 LF-MCG/0.5 IM SUSP: 0.5000 mL | Freq: Once | INTRAMUSCULAR | Status: DC

## 2019-06-18 MED ORDER — PHENYLEPHRINE 40 MCG/ML (10ML) SYRINGE FOR IV PUSH (FOR BLOOD PRESSURE SUPPORT)
80.0000 ug | PREFILLED_SYRINGE | INTRAVENOUS | Status: DC | PRN
  Filled 2019-06-18: qty 10

## 2019-06-18 MED ORDER — SENNOSIDES-DOCUSATE SODIUM 8.6-50 MG PO TABS
2.0000 | ORAL_TABLET | ORAL | Status: DC
  Administered 2019-06-19 (×2): 2 via ORAL
  Filled 2019-06-18 (×2): qty 2

## 2019-06-18 MED ORDER — SIMETHICONE 80 MG PO CHEW: 80.0000 mg | CHEWABLE_TABLET | ORAL | Status: DC | PRN

## 2019-06-18 MED ORDER — DIBUCAINE (PERIANAL) 1 % EX OINT: 1.0000 "application " | TOPICAL_OINTMENT | CUTANEOUS | Status: DC | PRN

## 2019-06-18 MED ORDER — COCONUT OIL OIL: 1.0000 "application " | TOPICAL_OIL | Status: DC | PRN

## 2019-06-18 MED ORDER — ONDANSETRON HCL 4 MG PO TABS: 4.0000 mg | ORAL_TABLET | ORAL | Status: DC | PRN

## 2019-06-18 MED ORDER — PRENATAL MULTIVITAMIN CH
1.0000 | ORAL_TABLET | Freq: Every day | ORAL | Status: DC
  Administered 2019-06-19: 1 via ORAL
  Filled 2019-06-18: qty 1

## 2019-06-18 MED ORDER — LACTATED RINGERS IV SOLN: 500.0000 mL | INTRAVENOUS | Status: DC | PRN

## 2019-06-18 MED ORDER — SOD CITRATE-CITRIC ACID 500-334 MG/5ML PO SOLN: 30.0000 mL | ORAL | Status: DC | PRN

## 2019-06-18 MED ORDER — FENTANYL-BUPIVACAINE-NACL 0.5-0.125-0.9 MG/250ML-% EP SOLN
12.0000 mL/h | EPIDURAL | Status: DC | PRN
  Filled 2019-06-18: qty 250

## 2019-06-18 MED ORDER — WITCH HAZEL-GLYCERIN EX PADS: 1.0000 "application " | MEDICATED_PAD | CUTANEOUS | Status: DC | PRN

## 2019-06-18 MED ORDER — ONDANSETRON HCL 4 MG/2ML IJ SOLN
4.0000 mg | Freq: Four times a day (QID) | INTRAMUSCULAR | Status: DC | PRN
  Administered 2019-06-18 (×2): 4 mg via INTRAVENOUS
  Filled 2019-06-18 (×2): qty 2

## 2019-06-18 MED ORDER — PHENYLEPHRINE 40 MCG/ML (10ML) SYRINGE FOR IV PUSH (FOR BLOOD PRESSURE SUPPORT): 80.0000 ug | PREFILLED_SYRINGE | INTRAVENOUS | Status: DC | PRN

## 2019-06-18 MED ORDER — ACETAMINOPHEN 325 MG PO TABS
650.0000 mg | ORAL_TABLET | ORAL | Status: DC | PRN
  Administered 2019-06-18 – 2019-06-19 (×2): 650 mg via ORAL
  Filled 2019-06-18 (×2): qty 2

## 2019-06-18 MED ORDER — ACETAMINOPHEN 325 MG PO TABS: 650.0000 mg | ORAL_TABLET | ORAL | Status: DC | PRN

## 2019-06-18 MED ORDER — LACTATED RINGERS IV SOLN: 500.0000 mL | Freq: Once | INTRAVENOUS | Status: DC

## 2019-06-18 MED ORDER — BENZOCAINE-MENTHOL 20-0.5 % EX AERO
1.0000 "application " | INHALATION_SPRAY | CUTANEOUS | Status: DC | PRN
  Filled 2019-06-18: qty 56

## 2019-06-18 MED ORDER — LIDOCAINE HCL (PF) 1 % IJ SOLN
INTRAMUSCULAR | Status: DC | PRN
  Administered 2019-06-18: 6 mL via EPIDURAL

## 2019-06-18 MED ORDER — SODIUM CHLORIDE (PF) 0.9 % IJ SOLN
INTRAMUSCULAR | Status: DC | PRN
  Administered 2019-06-18: 12 mL/h via EPIDURAL

## 2019-06-18 NOTE — Discharge Summary (Addendum)
Postpartum Discharge Summary     Patient Name: Ashlee Johnson DOB: 02/08/1996 MRN: [id number]  Date of admission: 06/18/2019 Delivering Provider: Julianne Handler   Date of discharge: 06/20/2019  Admitting diagnosis: 94WKS CTX Intrauterine pregnancy: [redacted]w[redacted]d    Secondary diagnosis:  Active Problems:   Indication for care in labor and delivery, antepartum   SVD (spontaneous vaginal delivery)  Additional problems: anemia in pregnancy     Discharge diagnosis: Term Pregnancy Delivered                                                                                                Post partum procedures none   Augmentation: none  Complications: None  Hospital course:  Onset of Labor With Vaginal Delivery     23y.o. yo G2P0010 at 379w4das admitted with SROM and in Active Labor on 06/18/2019. Patient had an uncomplicated labor course without further augmentation required. She progressed to complete without difficulty.   Membrane Rupture Time/Date: 3:32 PM ,06/18/2019   Intrapartum Procedures: Episiotomy: None [1]                                         Lacerations:  2nd degree [3]  Patient had a delivery of a Viable infant. 06/18/2019  Information for the patient's newborn:  JoTiona, Ruane0[[id number]]Delivery Method: Vag-Spont     Pateint had an uncomplicated postpartum course.  She is ambulating, tolerating a regular diet, passing flatus, and urinating well. Patient is discharged home in stable condition on 06/20/19.  Delivery time: 5:26 PM    Magnesium Sulfate received: No BMZ received: No Rhophylac:N/A MMR:N/A Transfusion:No  Physical exam  Vitals:   06/19/19 1427 06/19/19 1936 06/19/19 2213 06/20/19 0532  BP: 119/75  119/78 126/86  Pulse: 89  83 86  Resp: '18  16 16  '$ Temp: 98 F (36.7 C)  98 F (36.7 C) 98.9 F (37.2 C)  TempSrc: Oral  Oral Oral  SpO2: 100%  100% 100%  Weight:  75.4 kg    Height:  '5\' 4"'$  (1.626 m)     General: alert Lochia:  appropriate Uterine Fundus: firm Incision: N/A DVT Evaluation: No evidence of DVT seen on physical exam. Labs: Lab Results  Component Value Date   WBC 15.3 (H) 06/18/2019   HGB 11.1 (L) 06/18/2019   HCT 35.5 (L) 06/18/2019   MCV 91.0 06/18/2019   PLT 235 06/18/2019   CMP Latest Ref Rng & Units 11/26/2018  Glucose 70 - 99 mg/dL 74  BUN 6 - 20 mg/dL 9  Creatinine 0.44 - 1.00 mg/dL 0.43(L)  Sodium 135 - 145 mmol/L 135  Potassium 3.5 - 5.1 mmol/L 3.4(L)  Chloride 98 - 111 mmol/L 102  CO2 22 - 32 mmol/L 21(L)  Calcium 8.9 - 10.3 mg/dL 9.3    Discharge instruction: per After Visit Summary and "Baby and Me Booklet".  After visit meds:  Allergies as of 06/20/2019      Reactions   Percocet [oxycodone-acetaminophen] Hives, Nausea  And Vomiting      Medication List    STOP taking these medications   Blood Pressure Monitor [employer] Devi   famotidine 20 MG tablet Commonly known as: Pepcid   ferrous sulfate 325 (65 FE) MG EC tablet   terconazole 0.4 % vaginal cream Commonly known as: TERAZOL 7     TAKE these medications   acetaminophen 325 MG tablet Commonly known as: TYLENOL Take 2 tablets (650 mg total) by mouth every 6 (six) hours as needed for mild pain or moderate pain.   ibuprofen 600 MG tablet Commonly known as: ADVIL Take 1 tablet (600 mg total) by mouth every 6 (six) hours.   Vitafol Gummies 3.33-0.333-34.8 MG Chew Chew 3 each by mouth daily.       Diet: routine diet  Activity: Advance as tolerated. Pelvic rest for 6 weeks.   Outpatient follow up:4 weeks Follow up Appt: Future Appointments  Date Time Provider [place]  07/18/2019 10:30 AM Laury Deep, CNM [facility] None   Follow up Visit:   Please schedule this patient for Postpartum visit in: 4 weeks with the following provider: Any provider For C/S patients schedule nurse incision check in weeks 2 weeks: no Low risk pregnancy complicated by: none Delivery mode:  SVD Anticipated Birth  Control:  none PP Procedures needed: none  Schedule Integrated BH visit: no  Newborn Data: Live born female  Birth Weight: 2951 APGAR: 24, 9  Newborn Delivery   Birth date/time: 06/18/2019 17:26:00 Delivery type: Vaginal, Spontaneous      Baby Feeding: Bottle Disposition:home with mother   Maxie Better MD, PGY1  [facility]  I have seen and examined this patient and agree with above documentation in the resident's note.   Phill Myron, D.O. OB Fellow  06/20/2019, 10:09 AM

## 2019-06-18 NOTE — Progress Notes (Addendum)
Ashlee Johnson a 23 y.o.femaleG2P0010 with IUP at [redacted]w[redacted]d by LMPpresenting for SOL.  Subjective: Reports feeling comfortable with epidural in place. Patient's step-mother present at bedside and supportive.  Objective: Vitals:   06/18/19 1001 06/18/19 1031 06/18/19 1101 06/18/19 1131  BP: (!) 112/52 (!) 94/56 (!) 110/50 (!) 105/55  Pulse: 89 87 88 91  Resp:   16   Temp:      TempSrc:      SpO2:      Weight:        No intake/output data recorded. No intake/output data recorded.   FHT:  FHR: 150 bpm, variability: moderate,  accelerations:  Present,  decelerations:  Absent UC:   regular, every 1-4 minutes SVE:   Dilation: 8 Effacement: 100% Station: 0 Exam by: Lin Givens, SNM  Labs:   Recent Labs    06/18/19 0625  WBC 15.3*  HGB 11.1*  HCT 35.5*  PLT 235    Assessment / Plan: Ashlee Johnson a 23 y.o.G2P0010 at [redacted]w[redacted]d admitted for SOL.  Labor: Progressing normally - continue expectant management at this time Preeclampsia:  no signs or symptoms of toxicity and labs stable Fetal Wellbeing:  Category I Pain Control:  Epidural I/D:  GBS negative Anticipated MOD:  NSVD  Ashlee Johnson, CNM, BSN 06/18/2019, 12:24 PM

## 2019-06-18 NOTE — Progress Notes (Signed)
Ashlee Johnson is a 23 y.o. female G2P0010 with IUP at [redacted]w[redacted]d by LMP presenting for SOL.  Subjective: Reports feeling comfortable at this time with epidural in place.  Objective: Vitals:   06/18/19 0804 06/18/19 0818 06/18/19 0832 06/18/19 0841  BP: 126/68 110/61 (!) 105/43 119/70  Pulse: 93 (!) 101 (!) 111 93  Resp:      Temp:      TempSrc:      SpO2:      Weight:        No intake/output data recorded. No intake/output data recorded.   FHT:  FHR: 140 bpm, variability: moderate,  accelerations:  Present,  decelerations:  Absent UC:   regular, every 3-7 minutes SVE:   Dilation: 5 Effacement (%): 100 Station: -1 Exam by:: lee  Labs:   Recent Labs    06/18/19 0625  WBC 15.3*  HGB 11.1*  HCT 35.5*  PLT 235    Assessment / Plan: Ashlee Johnson is a 23 y.o. G2P0010 at [redacted]w[redacted]d admitted for SOL.   Labor: Progressing normally - plan to continue expectant management at this time Preeclampsia:  no signs or symptoms of toxicity and labs stable Fetal Wellbeing:  Category I Pain Control:  Epidural I/D:  GBS negative Anticipated MOD:  NSVD  Juanna Cao, CNM, BSN 06/18/2019, 9:06 AM

## 2019-06-18 NOTE — H&P (Addendum)
LABOR AND DELIVERY ADMISSION HISTORY AND PHYSICAL NOTE  Ashlee Johnson is a 23 y.o. female G2P0010 with IUP at 5559w4d by LMP presenting for labor. Pregnancy complicated by anemia.  She reports positive fetal movement. She denies leakage of fluid or vaginal bleeding. Denies headaches, vision changes, dyspnea, or lower extremity edema.   Prenatal History/Complications: Anemia during pregnancy   Past Medical History: Past Medical History:  Diagnosis Date  . Heart murmur   . Medical history non-contributory     Past Surgical History: Past Surgical History:  Procedure Laterality Date  . NO PAST SURGERIES      Obstetrical History: OB History    Gravida  2   Para      Term      Preterm      AB  1   Living        SAB  1   TAB      Ectopic      Multiple      Live Births              Social History: Social History   Socioeconomic History  . Marital status: Single    Spouse name: Not on file  . Number of children: Not on file  . Years of education: high school  . Highest education level: 10th grade  Occupational History  . Not on file  Social Needs  . Financial resource strain: Not hard at all  . Food insecurity    Worry: Never true    Inability: Never true  . Transportation needs    Medical: No    Non-medical: No  Tobacco Use  . Smoking status: Never Smoker  . Smokeless tobacco: Never Used  Substance and Sexual Activity  . Alcohol use: Not Currently    Frequency: Never  . Drug use: Never  . Sexual activity: Yes    Birth control/protection: None  Lifestyle  . Physical activity    Days per week: 0 days    Minutes per session: 0 min  . Stress: Not at all  Relationships  . Social connections    Talks on phone: More than three times a week    Gets together: More than three times a week    Attends religious service: Never    Active member of club or organization: No    Attends meetings of clubs or organizations: Never    Relationship status:  Never married  Other Topics Concern  . Not on file  Social History Narrative  . Not on file    Family History: Family History  Problem Relation Age of Onset  . Heart disease Father   . Cancer Father     Allergies: Allergies  Allergen Reactions  . Percocet [Oxycodone-Acetaminophen] Hives and Nausea And Vomiting    Medications Prior to Admission  Medication Sig Dispense Refill Last Dose  . acetaminophen (TYLENOL) 325 MG tablet Take 2 tablets (650 mg total) by mouth every 6 (six) hours as needed for mild pain or moderate pain. 30 tablet 0 06/18/2019 at Unknown time  . Blood Pressure Monitoring (BLOOD PRESSURE MONITOR AUTOMAT) DEVI Appropriate size cuff (forearm circumference) with automatic BP monitor. To be monitored regularly at home. ICD-10 code: Z34.80, Supervision of other normal pregnancy. 1 Device 0   . famotidine (PEPCID) 20 MG tablet Take 1 tablet (20 mg total) by mouth 2 (two) times daily. 60 tablet 2   . ferrous sulfate 325 (65 FE) MG EC tablet Take 1 tablet (325 mg  total) by mouth 2 (two) times daily with a meal. 60 tablet 2   . Prenatal Vit-Fe Phos-FA-Omega (VITAFOL GUMMIES) 3.33-0.333-34.8 MG CHEW Chew 3 each by mouth daily. 90 tablet 12   . terconazole (TERAZOL 7) 0.4 % vaginal cream Place 1 applicator vaginally at bedtime. Use for seven days 45 g 0      Review of Systems   All systems reviewed and negative except as stated in HPI  Blood pressure 134/82, pulse (!) 102, temperature 97.9 F (36.6 C), temperature source Oral, resp. rate 18, weight 75.4 kg, last menstrual period 09/21/2018, SpO2 100 %. General appearance: alert Lungs: clear to auscultation bilaterally Heart: regular rate and rhythm Abdomen: soft, non-tender; bowel sounds normal Extremities: No calf swelling or tenderness Presentation: cephalic on exam with palpable sutures.  Fetal monitoring: baseline 135, moderate variability, + accelerations, - decelerations  Uterine activity: q 2-3 min   Dilation: 4.5 Effacement (%): 70 Station: -1, 0 Exam by:: [employer] RN  Prenatal labs: ABO, Rh: --/--/PENDING (11/10 1856) Antibody: PENDING (11/10 0626) Rubella: 1.72 (05/27 1600) RPR: Non Reactive (08/26 0829)  HBsAg: Negative (05/27 1600)  HIV: Non Reactive (08/26 0829)  GBS: Negative/-- (10/29 1353)  1 hr Glucola: WNL  Genetic screening:  WNL  Anatomy US: 7/25- WNL. 508 g 32 %  Prenatal Transfer Tool  Maternal Diabetes: No Genetic Screening: Normal Maternal Ultrasounds/Referrals: Normal Fetal Ultrasounds or other Referrals:  None Maternal Substance Abuse:  No Significant Maternal Medications:  None Significant Maternal Lab Results: Group B Strep negative  Results for orders placed or performed during the hospital encounter of 06/18/19 (from the past 24 hour(s))  CBC   Collection Time: 06/18/19  6:25 AM  Result Value Ref Range   WBC 15.3 (H) 4.0 - 10.5 K/uL   RBC 3.90 3.87 - 5.11 MIL/uL   Hemoglobin 11.1 (L) 12.0 - 15.0 g/dL   HCT 35.5 (L) 36.0 - 46.0 %   MCV 91.0 80.0 - 100.0 fL   MCH 28.5 26.0 - 34.0 pg   MCHC 31.3 30.0 - 36.0 g/dL   RDW 14.4 11.5 - 15.5 %   Platelets 235 150 - 400 K/uL   nRBC 0.0 0.0 - 0.2 %  Type and screen [place]   Collection Time: 06/18/19  6:26 AM  Result Value Ref Range   ABO/RH(D) PENDING    Antibody Screen PENDING    Sample Expiration      06/21/2019,2359 Performed at [facility], [address], [place], [place] [address]     Patient Active Problem List   Diagnosis Date Noted  . Anemia during pregnancy 04/04/2019  . Vaginal discharge during pregnancy in second trimester 01/02/2019  . Supervision of normal first pregnancy, antepartum 12/10/2018    Assessment: Ashlee Johnson is a 23 y.o. G2P0010 at [redacted]w[redacted]d here for SOL.   #Labor: Presents in latent labor. Will plan for expectant management and augment as needed #Pain: IV pain medication and epidural upon request  #FWB: Cat I #ID: GBS  negative  #MOF: Breast #MOC: Undecided at this time.   Maxie Better, MD, PGY-1  OBGYN Faculty Teaching Service  06/18/2019, 7:05 AM  Midwife attestation: I have seen and examined this patient; I agree with above documentation in the resident's note.   PE: Gen: calm comfortable, NAD Resp: normal effort and rate Abd: gravid  ROS, labs, PMH reviewed  Assessment/Plan: Takya Vandivier is a 23 y.o. G2P0010 here for labor Admit to LD Labor: active FWB:  Cat I GBS neg Expectant mngt Anticipate SVD  Donette Larry, CNM  06/18/2019, 10:16 AM

## 2019-06-18 NOTE — Anesthesia Procedure Notes (Signed)
Epidural Patient location during procedure: OB Start time: 06/18/2019 7:49 AM End time: 06/18/2019 7:55 AM  Staffing Anesthesiologist: Janeece Riggers, MD  Preanesthetic Checklist Completed: patient identified, site marked, surgical consent, pre-op evaluation, timeout performed, IV checked, risks and benefits discussed and monitors and equipment checked  Epidural Patient position: sitting Prep: site prepped and draped and DuraPrep Patient monitoring: continuous pulse ox and blood pressure Approach: midline Location: L3-L4 Injection technique: LOR air  Needle:  Needle type: Tuohy  Needle gauge: 17 G Needle length: 9 cm and 9 Needle insertion depth: 5 cm cm Catheter type: closed end flexible Catheter size: 19 Gauge Catheter at skin depth: 10 cm Test dose: negative  Assessment Events: blood not aspirated, injection not painful, no injection resistance, negative IV test and no paresthesia

## 2019-06-18 NOTE — Lactation Note (Addendum)
This note was copied from a baby's chart. Lactation Consultation Note  Patient Name: Girl Travia Onstad [id number] Date: 06/18/2019  P1, 5 hour female infant. Initial at admission mom's feeding choice was breast and formula feeding. Per mom, she decided she doesn't want to breastfeed. LC discussed if she changes her mind we are here to help assist her.   Maternal Data    Feeding Feeding Type: Bottle Fed - Formula Nipple Type: Slow - flow  LATCH Score                   Interventions    Lactation Tools Discussed/Used     Consult Status      Vicente Serene 06/18/2019, 10:49 PM

## 2019-06-18 NOTE — MAU Note (Signed)
Patient presents to MAU c/o of ctx since 2300 11/9/. Patient states "They are more frequent and more intense. I have pressure in my butt and feel like I need to poop." Patient reports +FM, denies vaginal bleeding or LOF.

## 2019-06-18 NOTE — Anesthesia Preprocedure Evaluation (Signed)
Anesthesia Evaluation  Patient identified by MRN, date of birth, ID band Patient awake    Reviewed: Allergy & Precautions, H&P , NPO status , Patient's Chart, lab work & pertinent test results, reviewed documented beta blocker date and time   Airway Mallampati: II  TM Distance: >3 FB Neck ROM: full    Dental no notable dental hx.    Pulmonary neg pulmonary ROS,    Pulmonary exam normal breath sounds clear to auscultation       Cardiovascular negative cardio ROS Normal cardiovascular exam Rhythm:regular Rate:Normal     Neuro/Psych negative neurological ROS  negative psych ROS   GI/Hepatic negative GI ROS, Neg liver ROS,   Endo/Other  negative endocrine ROS  Renal/GU negative Renal ROS  negative genitourinary   Musculoskeletal   Abdominal   Peds  Hematology  (+) Blood dyscrasia, anemia ,   Anesthesia Other Findings   Reproductive/Obstetrics (+) Pregnancy                             Anesthesia Physical Anesthesia Plan  ASA: II  Anesthesia Plan: Epidural   Post-op Pain Management:    Induction:   PONV Risk Score and Plan:   Airway Management Planned:   Additional Equipment:   Intra-op Plan:   Post-operative Plan:   Informed Consent: I have reviewed the patients History and Physical, chart, labs and discussed the procedure including the risks, benefits and alternatives for the proposed anesthesia with the patient or authorized representative who has indicated his/her understanding and acceptance.       Plan Discussed with: Anesthesiologist, CRNA and Surgeon  Anesthesia Plan Comments:         Anesthesia Quick Evaluation

## 2019-06-19 NOTE — Anesthesia Postprocedure Evaluation (Signed)
Anesthesia Post Note  Patient: Ashlee Johnson  Procedure(s) Performed: AN AD HOC LABOR EPIDURAL     Patient location during evaluation: Mother Baby Anesthesia Type: Epidural Level of consciousness: awake and alert Pain management: pain level controlled Vital Signs Assessment: post-procedure vital signs reviewed and stable Respiratory status: spontaneous breathing, nonlabored ventilation and respiratory function stable Cardiovascular status: stable Postop Assessment: no headache, no backache and epidural receding Anesthetic complications: no    Last Vitals:  Vitals:   06/19/19 0100 06/19/19 0543  BP: 125/74 123/76  Pulse: 82 81  Resp: 18 16  Temp: 37.7 C 36.4 C  SpO2: 100% 100%    Last Pain:  Vitals:   06/19/19 0735  TempSrc:   PainSc: 4                  Rhiann Boucher

## 2019-06-19 NOTE — Progress Notes (Addendum)
Post Partum Day 1 Subjective: no complaints, up ad lib, voiding, tolerating PO and + flatus  Objective: Blood pressure 123/76, pulse 81, temperature 97.6 F (36.4 C), temperature source Oral, resp. rate 16, weight 75.4 kg, last menstrual period 09/21/2018, SpO2 100 %, unknown if currently breastfeeding.  Physical Exam:  General: alert, cooperative and no distress Lochia: appropriate Uterine Fundus: firm Incision: n/a DVT Evaluation: No evidence of DVT seen on physical exam.  Recent Labs    06/18/19 0625  HGB 11.1*  HCT 35.5*    Assessment/Plan: Plan for discharge tomorrow Breastfeeding   LOS: 1 day   Ashlee Johnson 06/19/2019, 6:15 AM

## 2019-06-20 ENCOUNTER — Encounter: Payer: Medicaid Other | Admitting: Obstetrics and Gynecology

## 2019-06-20 MED ORDER — IBUPROFEN 600 MG PO TABS: 600.0000 mg | ORAL_TABLET | Freq: Four times a day (QID) | ORAL | 0 refills | Status: DC

## 2019-06-20 NOTE — Discharge Instructions (Signed)

## 2019-07-18 ENCOUNTER — Encounter: Payer: Self-pay | Admitting: Obstetrics and Gynecology

## 2019-07-18 ENCOUNTER — Telehealth (INDEPENDENT_AMBULATORY_CARE_PROVIDER_SITE_OTHER): Payer: Medicaid Other | Admitting: Obstetrics and Gynecology

## 2019-07-18 ENCOUNTER — Other Ambulatory Visit: Payer: Self-pay

## 2019-07-18 DIAGNOSIS — Z1389 Encounter for screening for other disorder: Secondary | ICD-10-CM

## 2019-07-18 NOTE — Progress Notes (Signed)
MY CHART VIDEO POSTPARTUM VISIT ENCOUNTER NOTE  I connected with@ on 07/18/19 at 10:30 AM EST by My Chart video at home and verified that I am speaking with the correct person using two identifiers.   I discussed the limitations, risks, security and privacy concerns of performing an evaluation and management service by My Chart video and the availability of in person appointments. I also discussed with the patient that there may be a patient responsible charge related to this service. The patient expressed understanding and agreed to proceed.  Appointment Date: 07/18/2019  OBGYN Clinic: [facility]  Chief Complaint:  Postpartum Visit  History of Present Illness: Ashlee Johnson is a 23 y.o. Caucasian G2P1011 (Patient's last menstrual period was 09/21/2018 (exact date).), seen for the above chief complaint. Her past medical history is significant for none.   She is s/p normal spontaneous vaginal delivery on 06/18/2019 at 38 weeks; she was discharged to home on Postpartum Day #2. Pregnancy complicated by none. Baby "Ashlee Johnson" is doing well.  Complains of no complaints.  Vaginal bleeding or discharge: No  Mode of feeding infant: Bottle Intercourse: No  Contraception: no method - planning condoms when sexually active PP depression s/s: No . Score: 2 Any bowel or bladder issues: Yes  Pap smear: no abnormalities (date: 01/02/2019)  Review of Systems: Her 12 point review of systems is negative or as noted in the History of Present Illness.  Patient Active Problem List   Diagnosis Date Noted  . Indication for care in labor and delivery, antepartum 06/18/2019  . SVD (spontaneous vaginal delivery) 06/18/2019  . Anemia during pregnancy 04/04/2019  . Vaginal discharge during pregnancy in second trimester 01/02/2019  . Supervision of normal first pregnancy, antepartum 12/10/2018    Medications Morene Antu had no medications administered during this visit. No current outpatient  medications on file.   No current facility-administered medications for this visit.    Allergies Percocet [oxycodone-acetaminophen]  Physical Exam:  General:  Alert, oriented and cooperative.   Mental Status: Normal mood and affect perceived. Normal judgment and thought content.  Rest of physical exam deferred due to type of encounter  PP Depression Screening:   Edinburgh Postnatal Depression Scale - 07/18/19 1036      Edinburgh Postnatal Depression Scale:  In the Past 7 Days   I have been able to laugh and see the funny side of things.  0    I have looked forward with enjoyment to things.  0    I have blamed myself unnecessarily when things went wrong.  1    I have been anxious or worried for no good reason.  0    I have felt scared or panicky for no good reason.  0    Things have been getting on top of me.  0    I have been so unhappy that I have had difficulty sleeping.  0    I have felt sad or miserable.  0    I have been so unhappy that I have been crying.  1    The thought of harming myself has occurred to me.  0    Edinburgh Postnatal Depression Scale Total  2        Postpartum Depression risk not applicable to this patient.    Assessment:Patient is a 23 y.o. G2P1011 who is 4 weeks postpartum from a normal spontaneous vaginal delivery.  She is doing well.   Plan: Encounter for routine postpartum follow-up - Normal postpartum visit  RTC 1 year for annual exam  I discussed the assessment and treatment plan with the patient. The patient was provided an opportunity to ask questions and all were answered. The patient agreed with the plan and demonstrated an understanding of the instructions.   The patient was advised to call back or seek an in-person evaluation/go to the ED for any concerning postpartum symptoms.  I provided 5 minutes of non-face-to-face time during this encounter. There was 5 minutes of chart review time spent prior to this encounter. Total time  spent = 10 minutes.    Laury Deep, [place] for [place]

## 2020-03-17 ENCOUNTER — Encounter (HOSPITAL_COMMUNITY): Payer: Self-pay | Admitting: Emergency Medicine

## 2020-03-17 ENCOUNTER — Other Ambulatory Visit: Payer: Self-pay

## 2020-03-17 ENCOUNTER — Emergency Department (HOSPITAL_COMMUNITY)
Admission: EM | Admit: 2020-03-17 | Discharge: 2020-03-18 | Disposition: A | Discharge status: Home / Self Care | Payer: Medicaid Other | Attending: Emergency Medicine | Admitting: Emergency Medicine

## 2020-03-17 DIAGNOSIS — R0981 Nasal congestion: Secondary | ICD-10-CM | POA: Insufficient documentation

## 2020-03-17 DIAGNOSIS — Z5321 Procedure and treatment not carried out due to patient leaving prior to being seen by health care provider: Secondary | ICD-10-CM | POA: Diagnosis not present

## 2020-03-17 DIAGNOSIS — R630 Anorexia: Secondary | ICD-10-CM | POA: Diagnosis not present

## 2020-03-17 DIAGNOSIS — R509 Fever, unspecified: Secondary | ICD-10-CM | POA: Insufficient documentation

## 2020-03-17 DIAGNOSIS — R531 Weakness: Secondary | ICD-10-CM | POA: Diagnosis not present

## 2020-03-17 DIAGNOSIS — M791 Myalgia, unspecified site: Secondary | ICD-10-CM | POA: Diagnosis not present

## 2020-03-17 NOTE — ED Triage Notes (Signed)
Patient states she has generalized bodyaches, patient wants to be re-assessed. She is congested and states she still has bodyaches and fevers, weakness with standing, decreased appetite.

## 2020-03-19 ENCOUNTER — Telehealth: Payer: Self-pay | Admitting: *Deleted

## 2020-03-19 NOTE — Telephone Encounter (Signed)
Sorah Falkenstein presented to the ED and left before being seen by the provider on 03/18/20. The patient has been enrolled in an automated general discharge outreach program and 2 attempts to contact the patient will be made to follow up on their ED visit and subsequent needs. The care management team is available to provide assistance to this patient at any time.   Burnard Bunting, RN, BSN, CCRN [facility] [phone]

## 2020-04-20 IMAGING — US US MFM OB COMP + 14 WK
1 series · 13 of 28 positions shown · non-contrast
Comparison: none

[Series 1: us mfm ob comp + 14 wk · 77 acquisitions, 13 frames shown]
[im 3/77]
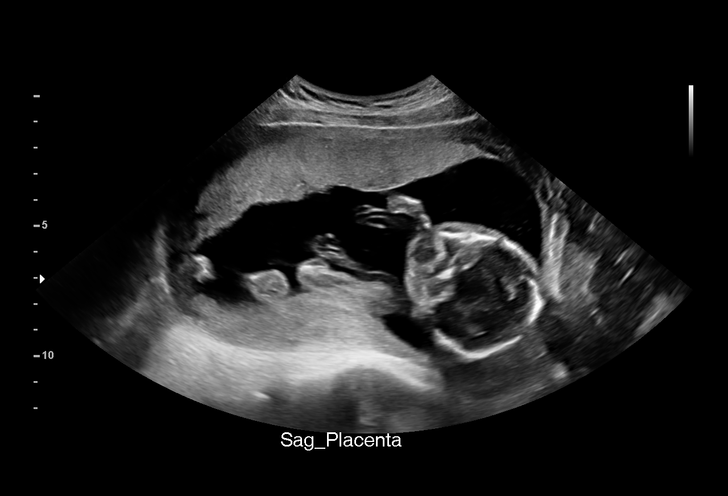
[im 9/77]
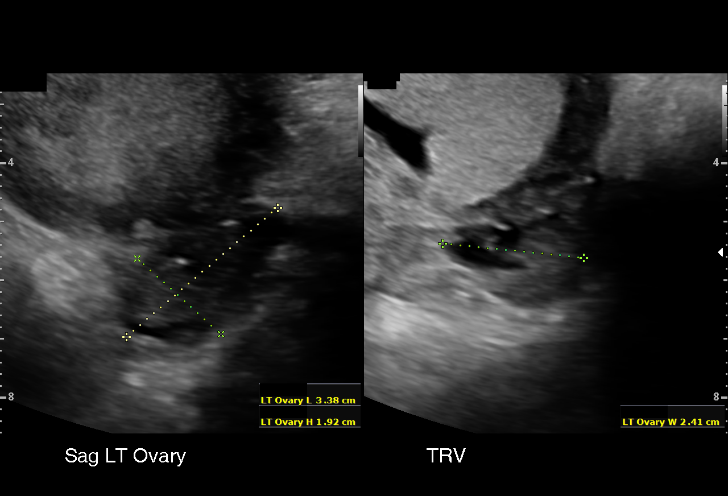
[im 15/77]
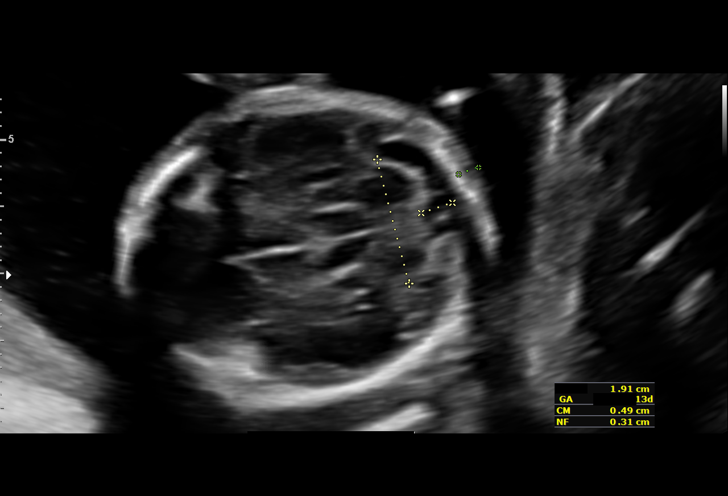
[im 20/77]
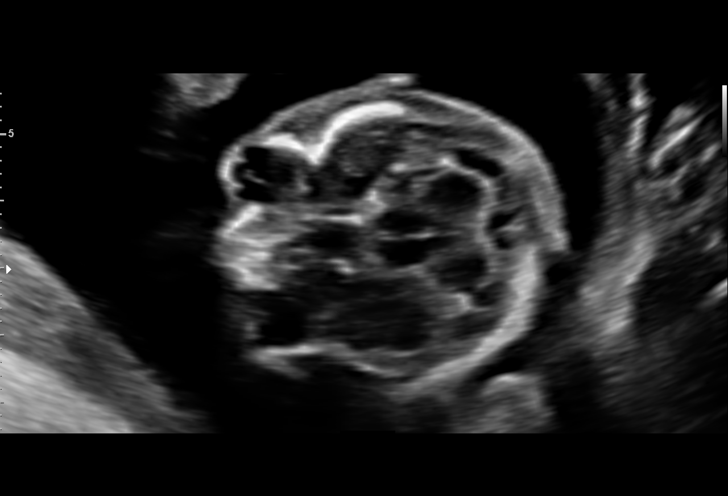
[im 26/77]
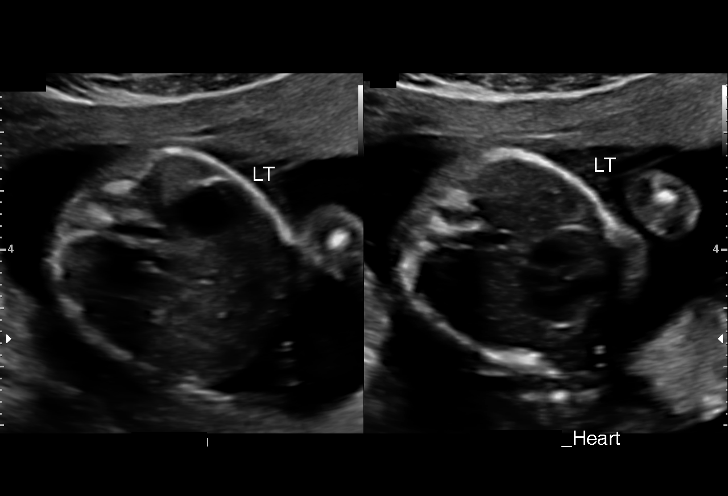
[im 31/77]
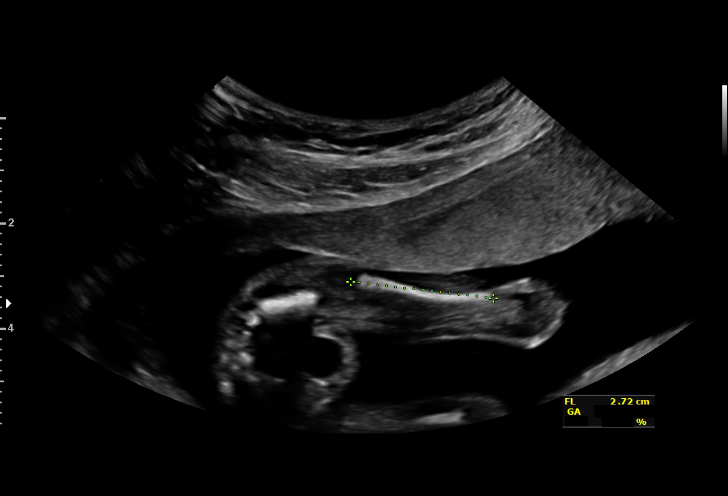
[im 40/77]
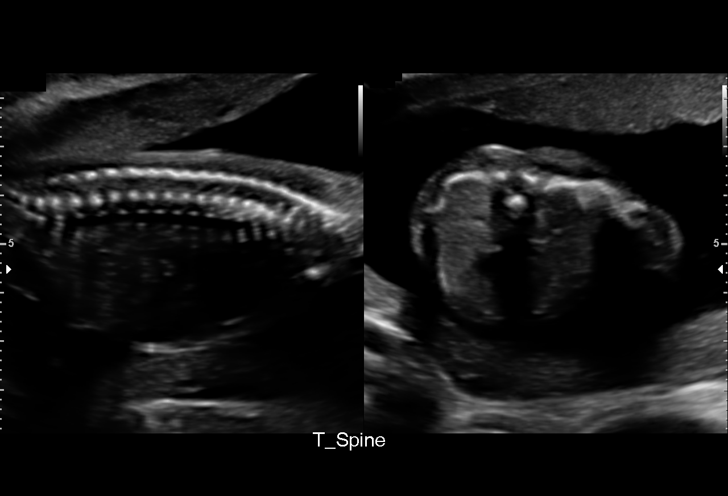
[im 46/77]
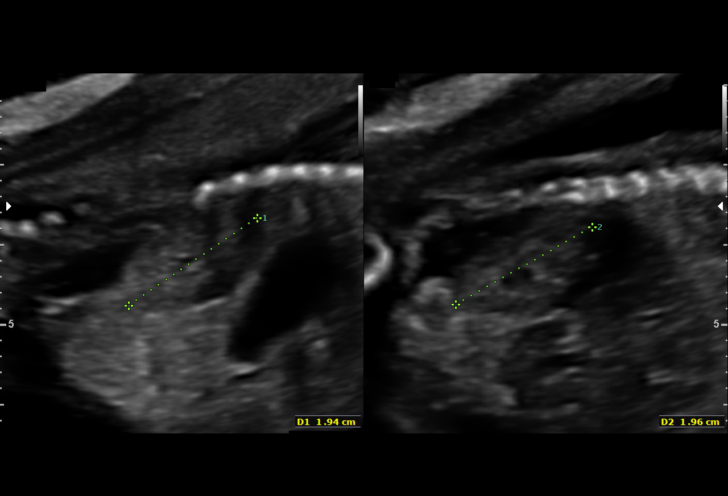
[im 51/77]
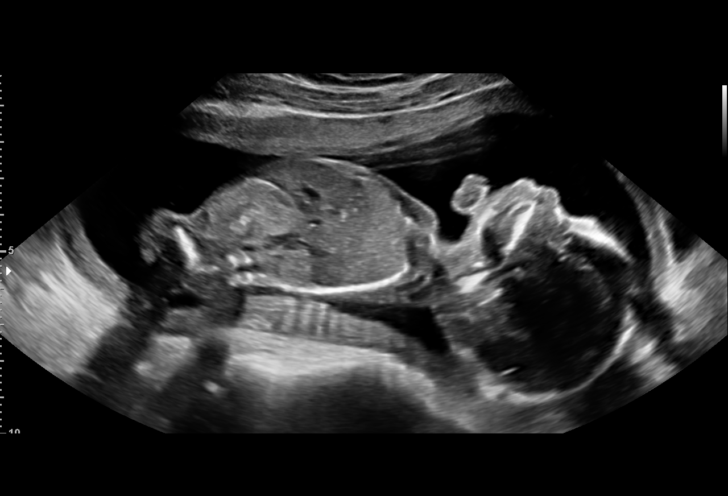
[im 57/77]
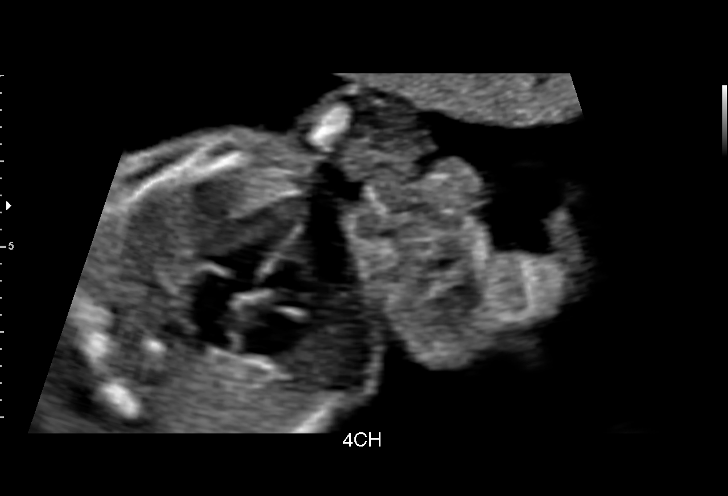
[im 62/77]
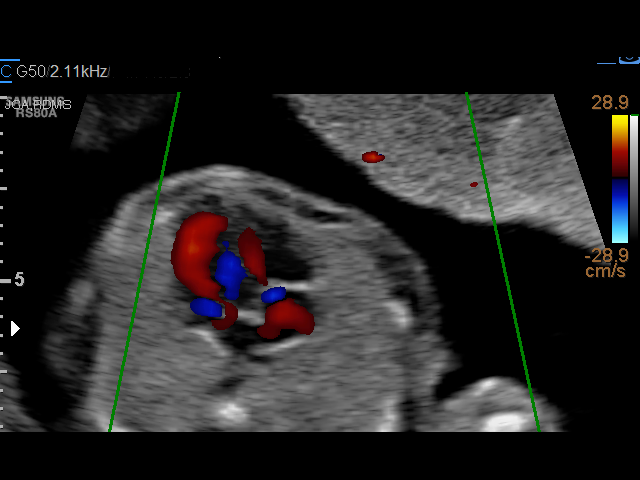
[im 68/77]
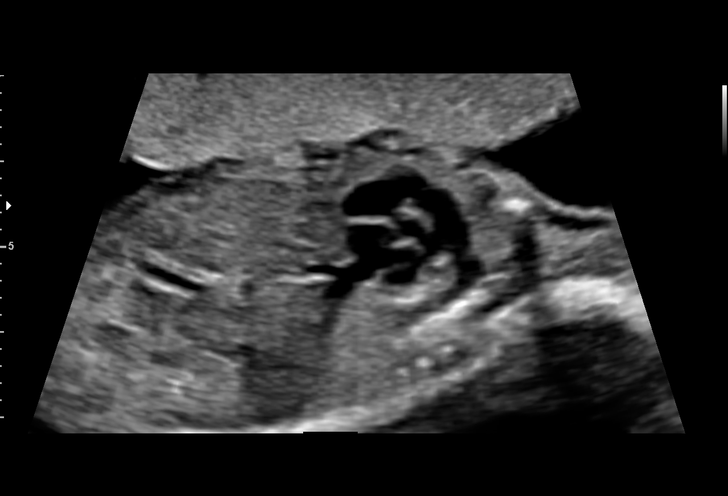
[im 74/77]
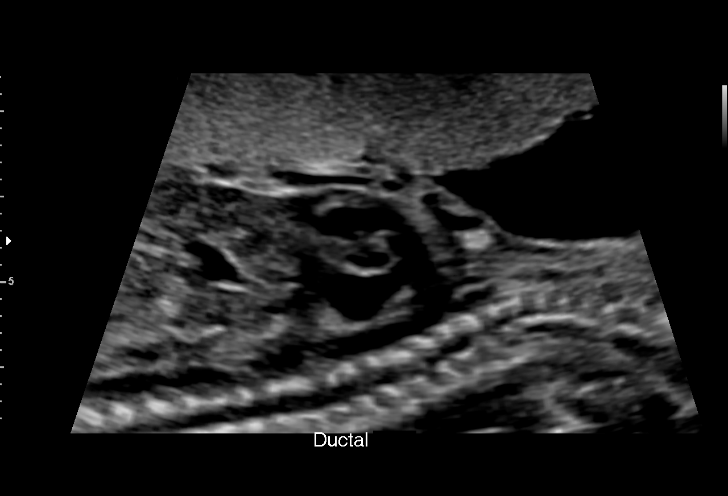

[13 of 28 positions shown; findings below may reference images not displayed]

MI HEL CNM

  1  US MFM OB COMP + 14 WK               76805.01     PRANEETH HENNINGS
 ----------------------------------------------------------------------

 ----------------------------------------------------------------------
Indications

  Encounter for antenatal screening for
  malformations ( Low Risk NIPS)
  18 weeks gestation of pregnancy
 ----------------------------------------------------------------------
Vital Signs

 BMI:
Fetal Evaluation

 Num Of Fetuses:         1
 Fetal Heart Rate(bpm):  151
 Cardiac Activity:       Observed
 Presentation:           Cephalic
 Placenta:               Anterior
 P. Cord Insertion:      Visualized

 Amniotic Fluid
 AFI FV:      Within normal limits

                             Largest Pocket(cm)

Biometry

 BPD:      44.3  mm     G. Age:  19w 3d         79  %    CI:        77.24   %    70 - 86
                                                         FL/HC:      17.6   %    16.1 -
 HC:      159.6  mm     G. Age:  18w 6d         47  %    HC/AC:      1.19        1.09 -
 AC:      133.8  mm     G. Age:  18w 6d         51  %    FL/BPD:     63.4   %
 FL:       28.1  mm     G. Age:  18w 4d         39  %    FL/AC:      21.0   %    20 - 24
 HUM:      27.2  mm     G. Age:  18w 5d         51  %
 CER:      19.1  mm     G. Age:  18w 4d         45  %
 NFT:       3.1  mm
 LV:        5.8  mm
 CM:        4.9  mm

 Est. FW:     256  gm      0 lb 9 oz     48  %
OB History

 Gravidity:    2          SAB:   1
 Living:       0
Gestational Age

 U/S Today:     19w 0d                                        EDD:   06/28/19
 Best:          18w 5d     Det. By:  Previous Ultrasound      EDD:   06/30/19
                                     (11/26/18)
Anatomy

 Cranium:               Appears normal         Aortic Arch:            Not well visualized
 Cavum:                 Appears normal         Ductal Arch:            Not well visualized
 Ventricles:            Appears normal         Diaphragm:              Appears normal
 Choroid Plexus:        Appears normal         Stomach:                Appears normal, left
                                                                       sided
 Cerebellum:            Appears normal         Abdomen:                Appears normal
 Posterior Fossa:       Appears normal         Abdominal Wall:         Appears nml (cord
                                                                       insert, abd wall)
 Nuchal Fold:           Appears normal         Cord Vessels:           Appears normal (3
                                                                       vessel cord)
 Face:                  Appears normal         Kidneys:                Appear normal
                        (orbits and profile)
 Lips:                  Appears normal         Bladder:                Appears normal
 Thoracic:              Appears normal         Spine:                  Limited views
                                                                       appear normal
 Heart:                 Appears normal         Upper Extremities:      Appears normal
                        (4CH, axis, and
                        situs)
 RVOT:                  Appears normal         Lower Extremities:      Appears normal
 LVOT:                  Appears normal

 Other:  Nasal bone visualized. Female gender Technically difficult due to fetal
         position.
Cervix Uterus Adnexa

 Cervix
 Length:           4.15  cm.
 Normal appearance by transabdominal scan.

 Uterus
 No abnormality visualized.

 Left Ovary
 Within normal limits.

 Right Ovary
 Within normal limits.

 Cul De Sac
 No free fluid seen.
 Adnexa
 No abnormality visualized. No adnexal mass
 visualized. No free fluid.
Impression

 Normal interval growth.  No ultrasonic evidence of structural
 fetal anomalies.
 Suboptimal views of the fetal anatomy obtained secondary to
 fetal position.
Recommendations

 Follow up anatomy in 4 weeks.

## 2020-05-18 IMAGING — US US MFM OB FOLLOW UP
1 series · 12 of 28 positions shown · non-contrast
Comparison: none

[Series 1: us mfm ob follow up · 48 acquisitions, 12 frames shown]
[im 2/48]
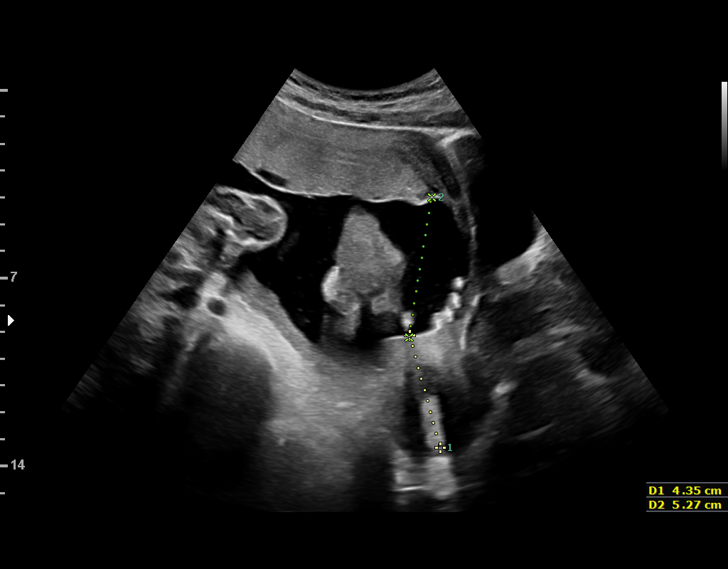
[im 6/48]
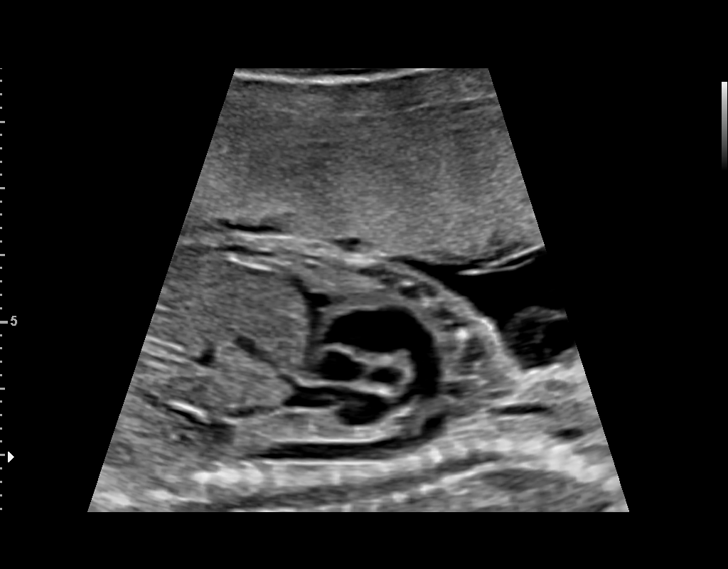
[im 9/48]
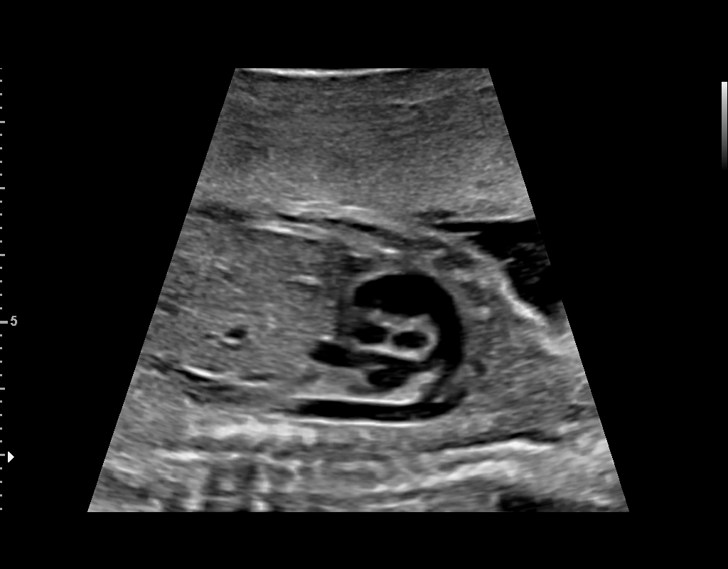
[im 14/48]
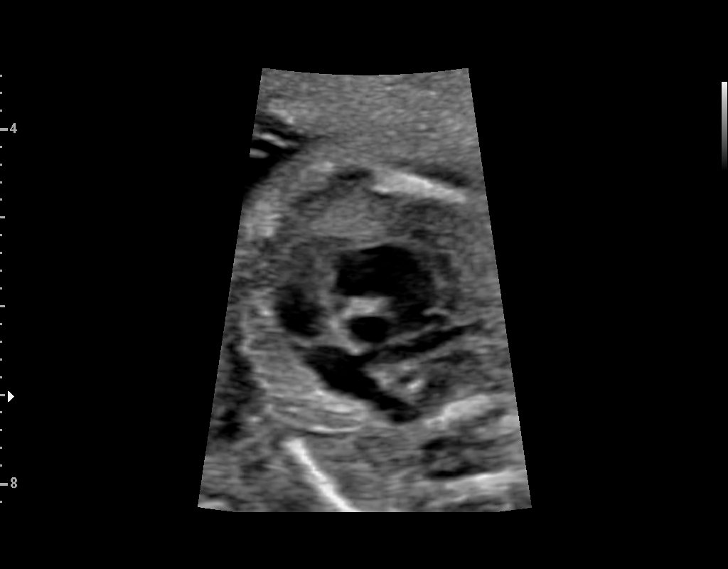
[im 18/48]
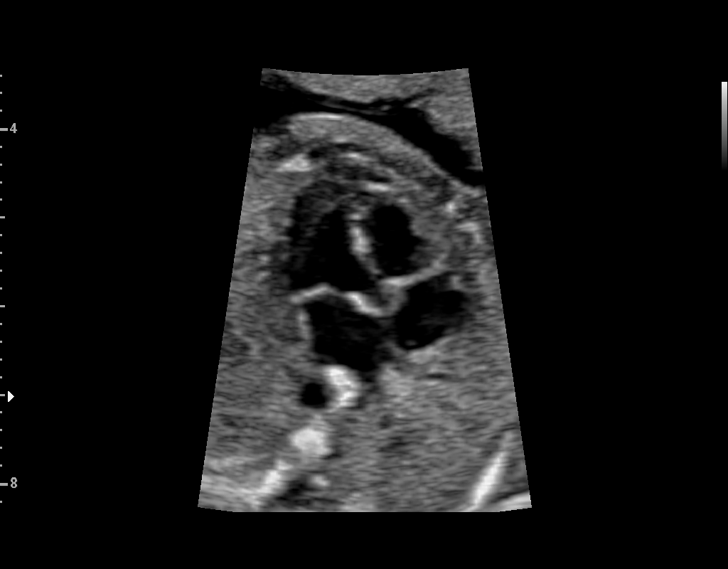
[im 21/48]
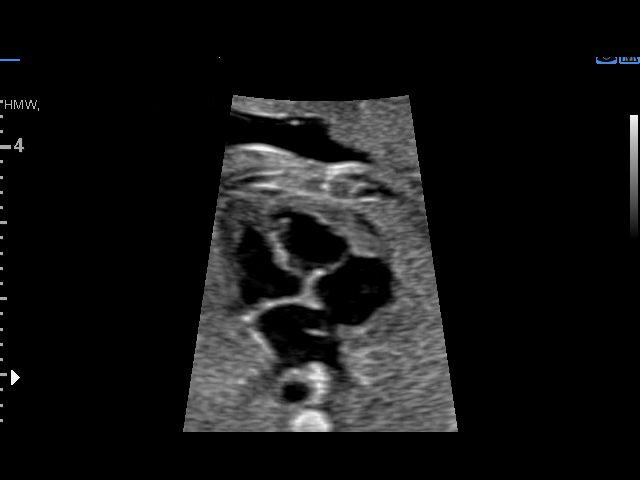
[im 27/48]
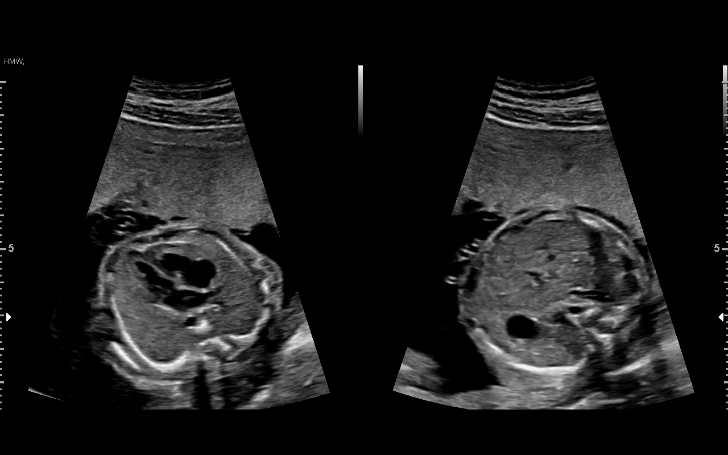
[im 30/48]
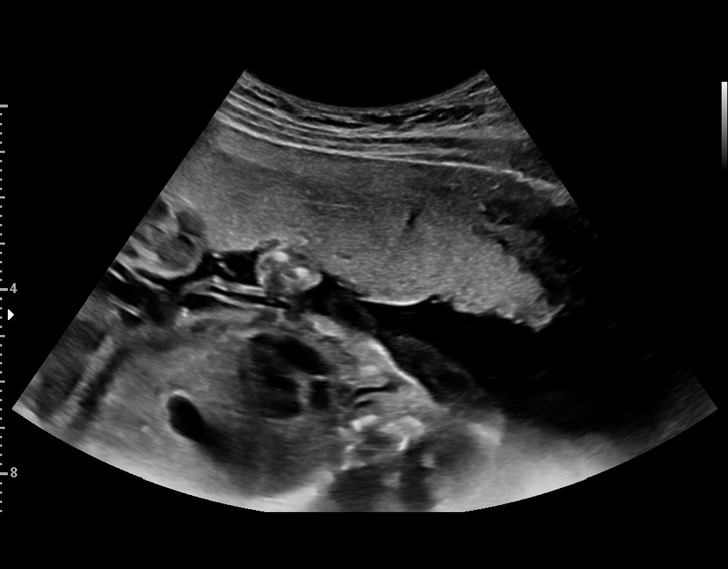
[im 34/48]
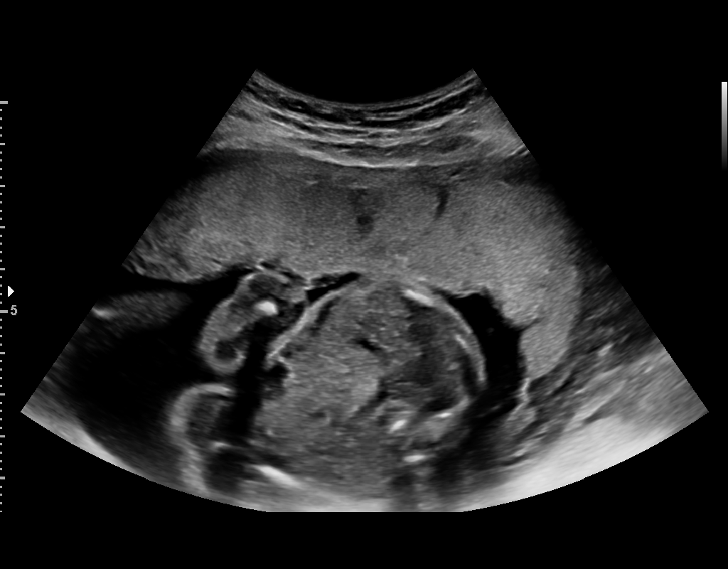
[im 39/48]
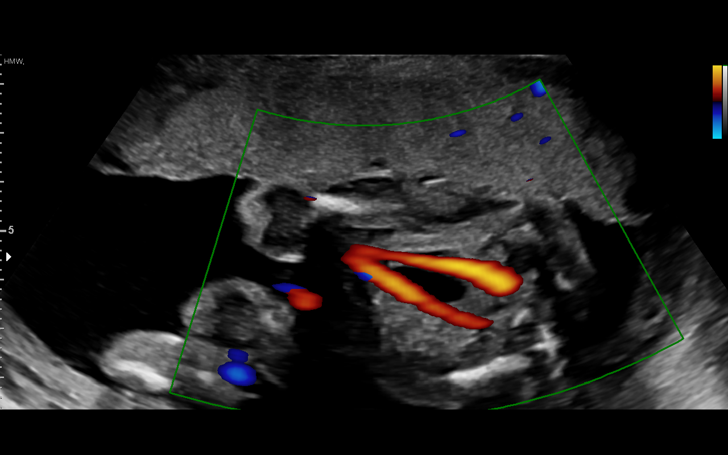
[im 42/48]
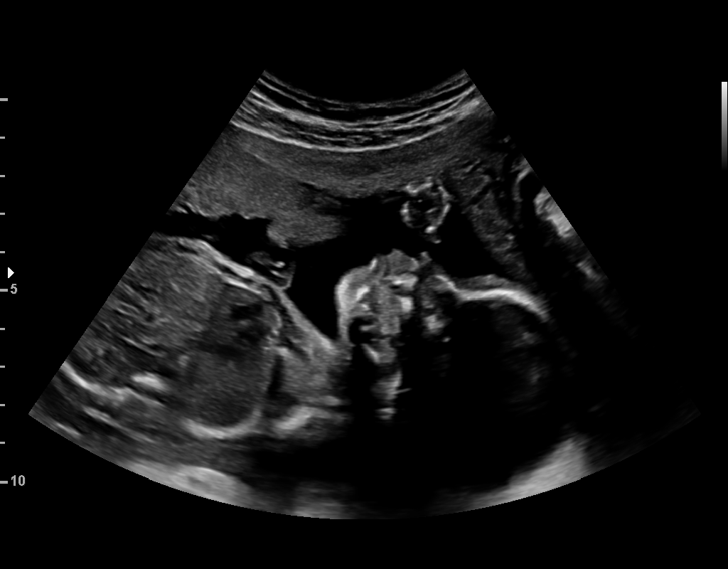
[im 46/48]
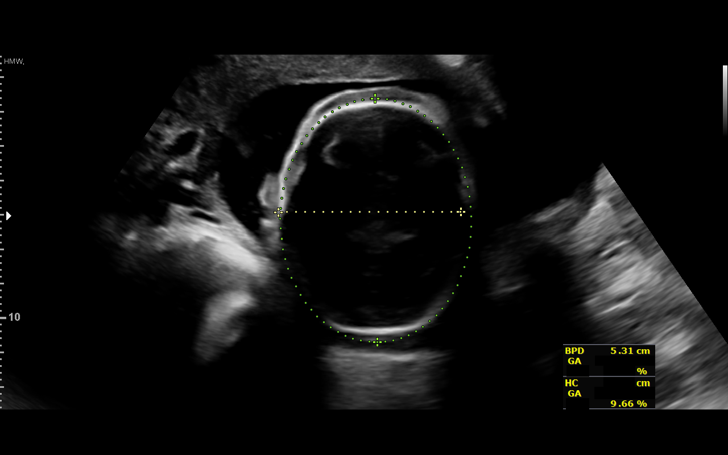

[12 of 28 positions shown; findings below may reference images not displayed]

CIARRA CNM

                                                       ROCHA ALVES
 ----------------------------------------------------------------------

 ----------------------------------------------------------------------
Indications

  22 weeks gestation of pregnancy
  Encounter for other antenatal screening
  follow-up (low risk NIPS)
 ----------------------------------------------------------------------
Vital Signs

                                                Height:        5'4"
Fetal Evaluation

 Num Of Fetuses:         1
 Fetal Heart Rate(bpm):  155
 Cardiac Activity:       Observed
 Presentation:           Cephalic
 Placenta:               Anterior
 P. Cord Insertion:      Previously Visualized

 Amniotic Fluid
 AFI FV:      Within normal limits

                             Largest Pocket(cm)

Biometry

 BPD:      53.7  mm     G. Age:  22w 2d         31  %    CI:        72.37   %    70 - 86
                                                         FL/HC:      19.6   %    19.2 -
 HC:      200.8  mm     G. Age:  22w 2d         19  %    HC/AC:      1.15        1.05 -
 AC:      174.9  mm     G. Age:  22w 3d         32  %    FL/BPD:     73.2   %    71 - 87
 FL:       39.3  mm     G. Age:  22w 5d         36  %    FL/AC:      22.5   %    20 - 24
 HUM:      36.4  mm     G. Age:  22w 5d         43  %

 Est. FW:     508  gm      1 lb 2 oz     32  %
OB History

 Gravidity:    2          SAB:   1
 Living:       0
Gestational Age

 U/S Today:     22w 3d                                        EDD:   07/02/19
 Best:          22w 5d     Det. By:  Previous Ultrasound      EDD:   06/30/19
                                     (11/26/18)
Anatomy

 Cranium:               Appears normal         Aortic Arch:            Appears normal
 Cavum:                 Previously seen        Ductal Arch:            Appears normal
 Ventricles:            Appears normal         Diaphragm:              Appears normal
 Choroid Plexus:        Previously seen        Stomach:                Appears normal, left
                                                                       sided
 Cerebellum:            Previously seen        Abdomen:                Previously seen
 Posterior Fossa:       Previously seen        Abdominal Wall:         Previously seen
 Nuchal Fold:           Previously seen        Cord Vessels:           Previously seen
 Face:                  Orbits and profile     Kidneys:                Appear normal
                        previously seen
 Lips:                  Previously seen        Bladder:                Appears normal
 Thoracic:              Appears normal         Spine:                  Limited views
                                                                       appear normal prev
 Heart:                 Appears normal         Upper Extremities:      Previously seen
                        (4CH, axis, and
                        situs)
 RVOT:                  Appears normal         Lower Extremities:      Previously seen
 LVOT:                  Appears normal

 Other:  Nasal bone visualized previously. Female gender Technically difficult
         due to fetal position.
Cervix Uterus Adnexa

 Cervix
 Length:            4.4  cm.
 Normal appearance by transabdominal scan.

 Uterus
 No abnormality visualized.

 Left Ovary
 No adnexal mass visualized.

 Right Ovary
 No adnexal mass visualized.

 Cul De Sac
 No free fluid seen.

 Adnexa
 No abnormality visualized.
Impression

 Normal interval growth.
 Limited spine views however the spine is seen and
 intracranial anatomy appears normal.
Recommendations

 Follow up as clinically indicated.

## 2020-09-02 IMAGING — US US MFM FETAL BPP W/O NON-STRESS
1 series · 10 of 10 positions shown · non-contrast
Comparison: none

[Series 1: us mfm fetal bpp w/o non-stress · 10 acquisitions, 10 frames shown]
[im 1/10]
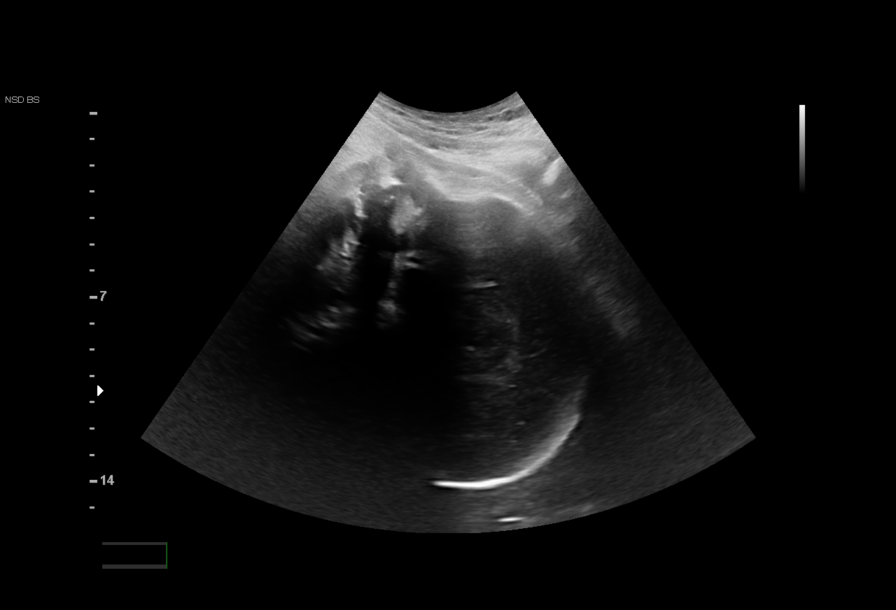
[im 2/10]
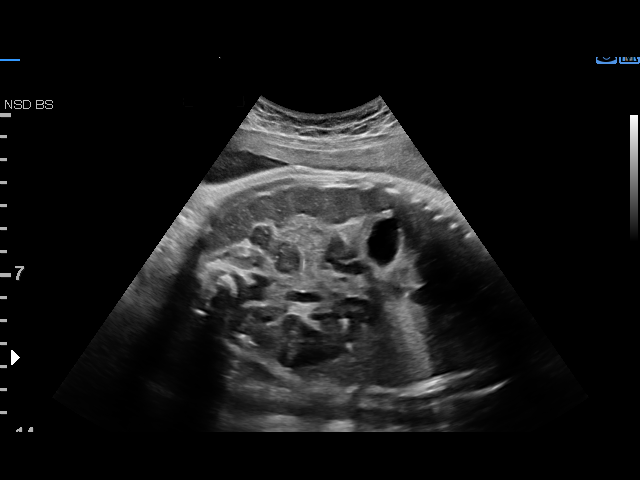
[im 3/10]
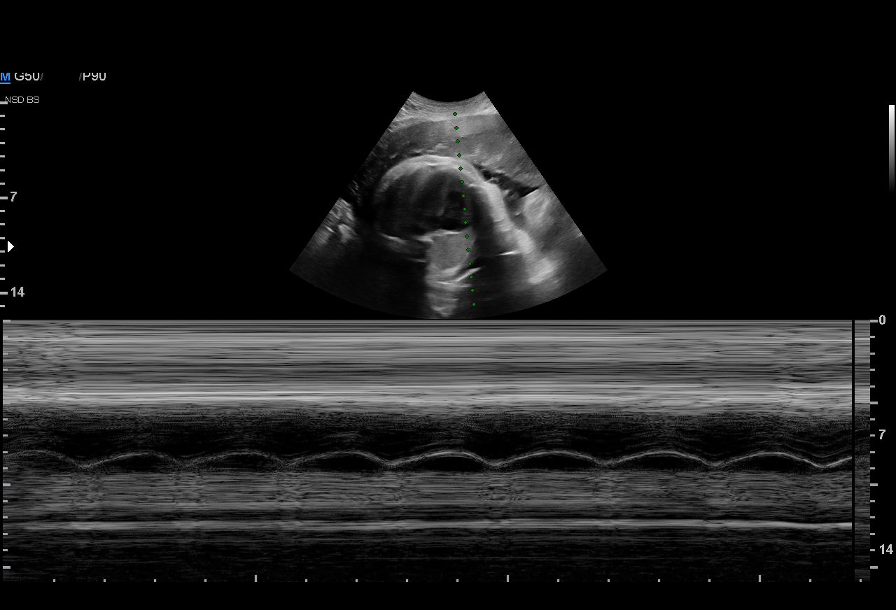
[im 4/10]
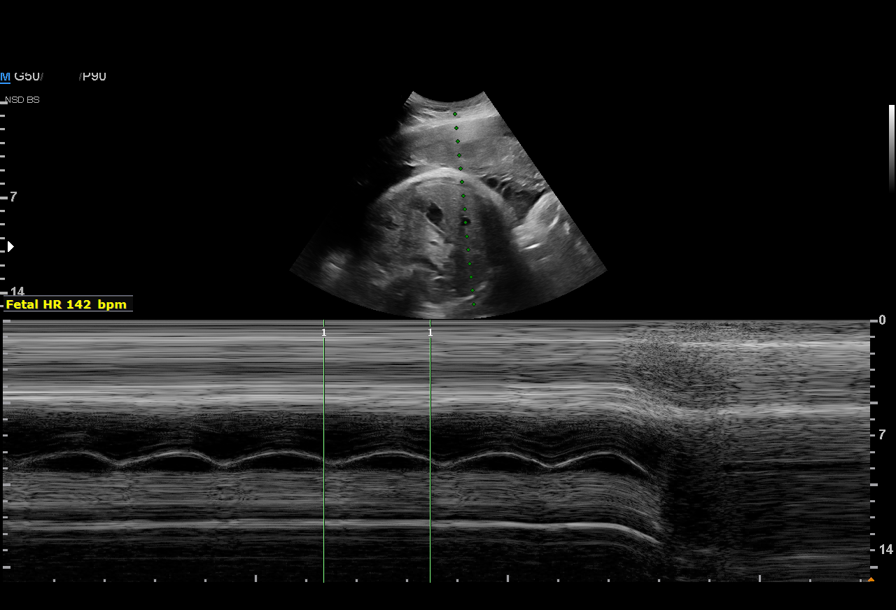
[im 5/10]
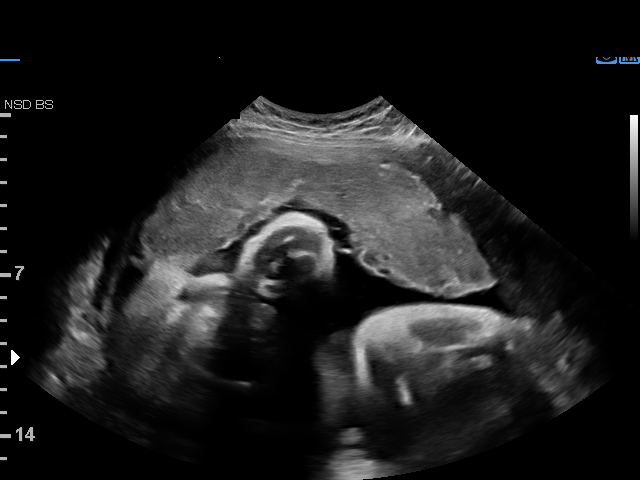
[im 6/10]
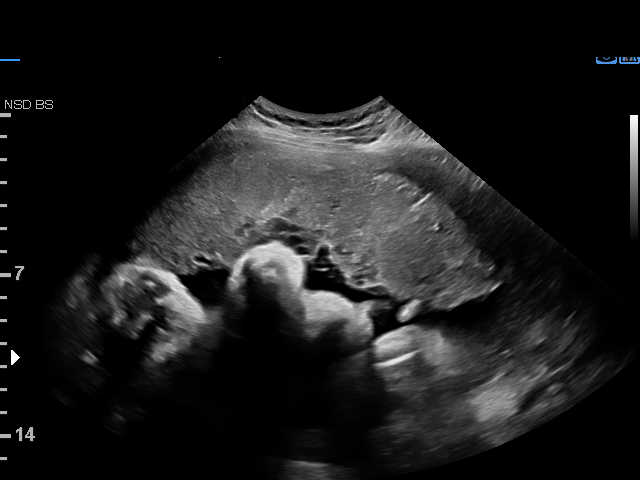
[im 7/10]
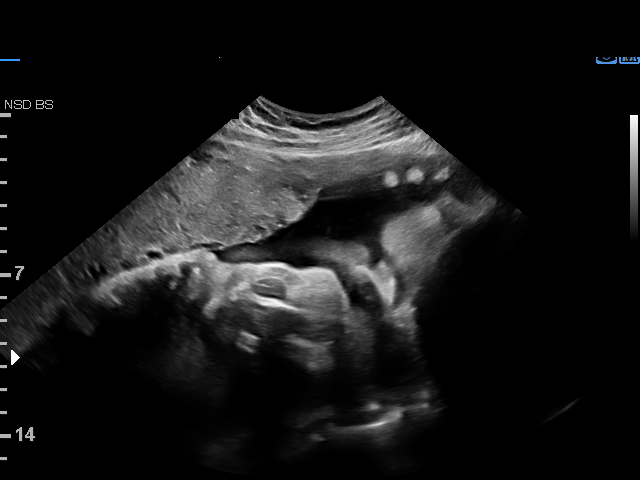
[im 8/10]
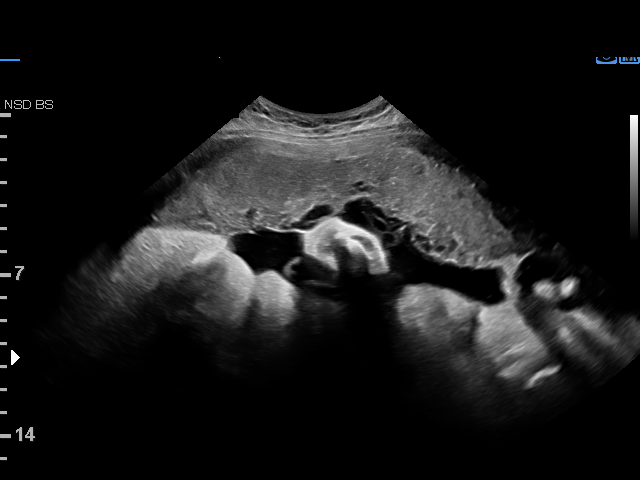
[im 9/10]
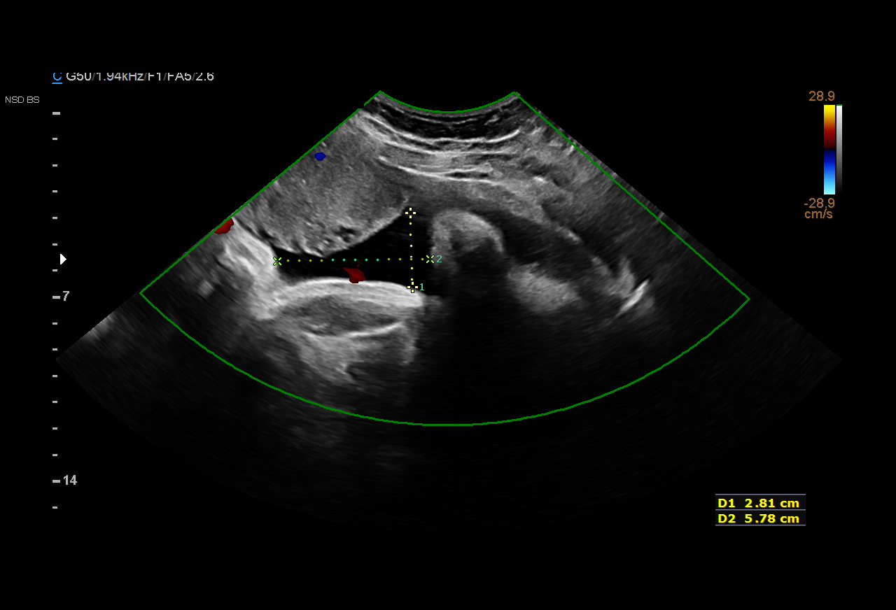
[im 10/10]
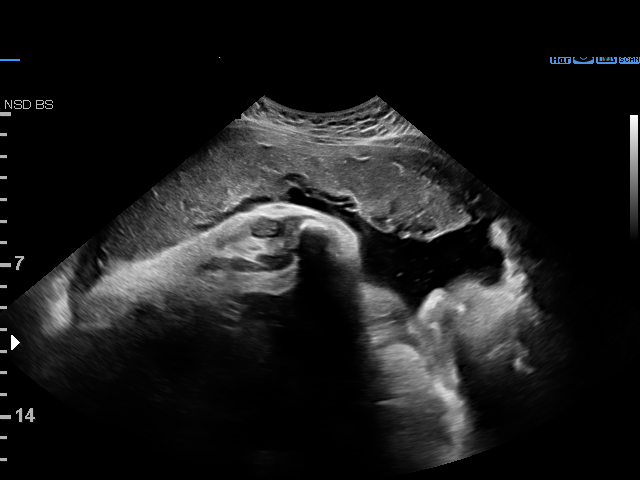

[10 of 10 positions shown; findings below may reference images not displayed]

Attending:        Giovanny Hyppolite        Secondary Phy.:   [facility]-
                                                            MAU/Triage
 Referred By:      ALIZABETH PARIKH               [facility]                             [[facility]]

 ----------------------------------------------------------------------

 ----------------------------------------------------------------------
Indications

  Decreased fetal movements, third trimester,
  unspecified
  38 weeks gestation of pregnancy
 ----------------------------------------------------------------------
Vital Signs

                                                Height:        5'4"
Fetal Evaluation

 Num Of Fetuses:         1
 Fetal Heart Rate(bpm):  142
 Cardiac Activity:       Observed
 Presentation:           Cephalic

 Amniotic Fluid
 AFI FV:      Within normal limits

 AFI Sum(cm)     %Tile       Largest Pocket(cm)
 11.34           36

 RUQ(cm)       RLQ(cm)       LUQ(cm)        LLQ(cm)

Biophysical Evaluation

 Amniotic F.V:   Within normal limits       F. Tone:        Observed
 F. Movement:    Observed                   Score:          [DATE]
 F. Breathing:   Observed
OB History

 Gravidity:    2          SAB:   1
 Living:       0
Gestational Age

 Best:          38w 1d     Det. By:  Previous Ultrasound      EDD:   06/30/19
                                     (11/26/18)
Impression

 Patient was evaluated for c/o decreased fetal movements.
 Amniotic fluid is normal and good fetal activity is seen.
 Antenatal testing is reassuring. BPP [DATE].
                 Margot, Didou

## 2022-07-04 ENCOUNTER — Other Ambulatory Visit: Payer: Self-pay

## 2022-07-04 ENCOUNTER — Emergency Department (HOSPITAL_COMMUNITY)
Admission: EM | Admit: 2022-07-04 | Discharge: 2022-07-04 | Disposition: A | Discharge status: Home / Self Care | Payer: Medicaid Other | Attending: Emergency Medicine | Admitting: Emergency Medicine

## 2022-07-04 DIAGNOSIS — K029 Dental caries, unspecified: Secondary | ICD-10-CM | POA: Diagnosis not present

## 2022-07-04 DIAGNOSIS — K0889 Other specified disorders of teeth and supporting structures: Secondary | ICD-10-CM | POA: Insufficient documentation

## 2022-07-04 MED ORDER — DICLOFENAC SODIUM 50 MG PO TBEC: 50.0000 mg | DELAYED_RELEASE_TABLET | Freq: Three times a day (TID) | ORAL | 0 refills | Status: AC

## 2022-07-04 MED ORDER — ACETAMINOPHEN 500 MG PO TABS
1000.0000 mg | ORAL_TABLET | Freq: Once | ORAL | Status: AC
  Administered 2022-07-04: 1000 mg via ORAL
  Filled 2022-07-04: qty 2

## 2022-07-04 MED ORDER — IBUPROFEN 800 MG PO TABS
800.0000 mg | ORAL_TABLET | Freq: Once | ORAL | Status: AC
  Administered 2022-07-04: 800 mg via ORAL
  Filled 2022-07-04: qty 1

## 2022-07-04 MED ORDER — AMOXICILLIN-POT CLAVULANATE 875-125 MG PO TABS: 1.0000 | ORAL_TABLET | Freq: Two times a day (BID) | ORAL | 0 refills | Status: DC

## 2022-07-04 NOTE — ED Triage Notes (Signed)
Patient coming to ED for evaluation of dental pain.  Reports pain to L upper wisdom tooth.  Pain started on November 22.  Unable to relieve pain at home with OTC medications.  States pain radiates into ear and neck.  No reports of fever.

## 2022-07-04 NOTE — ED Provider Notes (Incomplete)
[facility]-EMERGENCY DEPT Provider Note   CSN: [id number] Arrival date & time: 07/04/22  1953     History {Add pertinent medical, surgical, social history, OB history to HPI:1} Chief Complaint  Patient presents with  . Dental Pain    Ashlee Johnson is a 26 y.o. female.  With no significant past medical history who presents the ED for evaluation of dental pain.  She states she has had left upper molar pain on and off for the past 2 years, however 2 weeks ago the pain became constant and has progressively gotten worse.  She states that she has had similar pain in the past and had to have her right lower wisdom teeth removed which solve the issue.  She states she just moved here and has not been able to get into a dentist because she does not have a car.  Has been taking ibuprofen and Tylenol intermittently for the pain with minimal relief.  Pain does radiate into her left ear and neck when she is chewing.  Denies fevers, trismus, hoarse voice, difficulty swallowing, difficulty handling oral secretions.   Dental Pain      Home Medications Prior to Admission medications   Not on File      Allergies    Percocet [oxycodone-acetaminophen]    Review of Systems   Review of Systems  HENT:  Positive for dental problem.   All other systems reviewed and are negative.   Physical Exam Updated Vital Signs BP 118/81 (BP Location: Right Arm)   Pulse (!) 104   Temp 98.2 F (36.8 C) (Oral)   Resp 18   Ht 5\' 4"  (1.626 m)   Wt 72.6 kg   SpO2 100%   BMI 27.46 kg/m  Physical Exam Vitals and nursing note reviewed.  Constitutional:      General: She is not in acute distress.    Appearance: Normal appearance. She is normal weight. She is not ill-appearing.  HENT:     Head: Normocephalic and atraumatic.     Mouth/Throat:     Mouth: Mucous membranes are moist.     Dentition: Dental tenderness and dental caries present. No dental abscesses.     Pharynx: Oropharynx  is clear. Uvula midline. No oropharyngeal exudate or posterior oropharyngeal erythema.     Tonsils: No tonsillar exudate or tonsillar abscesses. 0 on the right. 0 on the left.     Comments: Left upper molar appears to have grown in at an angle. no fractures, erythema, drainage or swelling.  Palate is soft.  There is minimal tenderness to palpation over the left upper teeth.  Very poor dentition. Pulmonary:     Effort: Pulmonary effort is normal. No respiratory distress.  Abdominal:     General: Abdomen is flat.  Musculoskeletal:        General: Normal range of motion.     Cervical back: Neck supple.  Skin:    General: Skin is warm and dry.  Neurological:     Mental Status: She is alert and oriented to person, place, and time.  Psychiatric:        Mood and Affect: Mood normal.        Behavior: Behavior normal.    ED Results / Procedures / Treatments   Labs (all labs ordered are listed, but only abnormal results are displayed) Labs Reviewed - No data to display  EKG None  Radiology No results found.  Procedures Procedures  {Document cardiac monitor, telemetry assessment procedure when appropriate:1}  Medications Ordered in ED Medications  ibuprofen (ADVIL) tablet 800 mg (has no administration in time range)  acetaminophen (TYLENOL) tablet 1,000 mg (has no administration in time range)    ED Course/ Medical Decision Making/ A&P                           Medical Decision Making Risk OTC drugs. Prescription drug management.  This patient presents to the ED for concern of dental pain, this involves an extensive number of treatment options, and is a complaint that carries with it a high risk of complications and morbidity.  The differential diagnosis includes dental fracture, abscess, impaction  My initial workup includes pain medication  Additional history obtained from: Nursing notes from this visit.  Afebrile, hemodynamically stable.  26 year old female presenting  to the ED for evaluation of dental pain.  This pain has been on and off for the last 2 years, however has gotten more consistent over the last 2 weeks.  She has been unable to see a dentist due to not having a car.  Has been trying Tylenol and ibuprofen intermittently without relief of her pain.  Physical exam shows an impacted left upper molar without signs of infection.  Will treat patient's pain in the ED and strongly encouraged her to alternate Tylenol and ibuprofen on a consistent basis moving forward until she can see a dentist.  Strongly encourage patient to get into see her dentist as soon as possible.  Will send a prescription for Augmentin to cover possible infection until she is able to be seen by a dentist.  I have low suspicion for Ludwig's angina, peritonsillar or retropharyngeal abscess as her physical exam is largely benign other than the impacted tooth.  Gave patient strict return precautions.  Stable at discharge.  At this time there does not appear to be any evidence of an acute emergency medical condition and the patient appears stable for discharge with appropriate outpatient follow up. Diagnosis was discussed with patient who verbalizes understanding of care plan and is agreeable to discharge. I have discussed return precautions with patient who verbalizes understanding. Patient encouraged to follow-up with their PCP within 1 week. All questions answered.  Note: Portions of this report may have been transcribed using voice recognition software. Every effort was made to ensure accuracy; however, inadvertent computerized transcription errors may still be present.  ***  {Document critical care time when appropriate:1} {Document review of labs and clinical decision tools ie heart score, Chads2Vasc2 etc:1}  {Document your independent review of radiology images, and any outside records:1} {Document your discussion with family members, caretakers, and with consultants:1} {Document social  determinants of health affecting pt's care:1} {Document your decision making why or why not admission, treatments were needed:1} Final Clinical Impression(s) / ED Diagnoses Final diagnoses:  None    Rx / DC Orders ED Discharge Orders     None

## 2022-07-04 NOTE — ED Provider Notes (Signed)
[facility]-EMERGENCY DEPT Provider Note     CSN: [id number] Arrival date & time: 07/04/22  1953     History     Chief Complaint  Patient presents with   Dental Pain      Ashlee Johnson is a 26 y.o. female.  With no significant past medical history who presents the ED for evaluation of dental pain.  She states she has had left upper molar pain on and off for the past 2 years, however 2 weeks ago the pain became constant and has progressively gotten worse.  She states that she has had similar pain in the past and had to have her right lower wisdom teeth removed which solve the issue.  She states she just moved here and has not been able to get into a dentist because she does not have a car.  Has been taking ibuprofen and Tylenol intermittently for the pain with minimal relief.  Pain does radiate into her left ear and neck when she is chewing.  Denies fevers, trismus, hoarse voice, difficulty swallowing, difficulty handling oral secretions.     Dental Pain       Home Medications Prior to Admission medications   Not on File       Allergies            Percocet [oxycodone-acetaminophen]     Review of Systems   Review of Systems  HENT:  Positive for dental problem.   All other systems reviewed and are negative.     Physical Exam Updated Vital Signs BP 118/81 (BP Location: Right Arm)   Pulse (!) 104   Temp 98.2 F (36.8 C) (Oral)   Resp 18   Ht 5\' 4"  (1.626 m)   Wt 72.6 kg   SpO2 100%   BMI 27.46 kg/m  Physical Exam Vitals and nursing note reviewed.  Constitutional:      General: She is not in acute distress.    Appearance: Normal appearance. She is normal weight. She is not ill-appearing.  HENT:     Head: Normocephalic and atraumatic.     Mouth/Throat:     Mouth: Mucous membranes are moist.     Dentition: Dental tenderness and dental caries present. No dental abscesses.     Pharynx: Oropharynx is clear. Uvula midline. No oropharyngeal exudate  or posterior oropharyngeal erythema.     Tonsils: No tonsillar exudate or tonsillar abscesses. 0 on the right. 0 on the left.     Comments: Left upper molar appears to have grown in at an angle. no fractures, erythema, drainage or swelling.  Palate is soft.  There is minimal tenderness to palpation over the left upper teeth.  Very poor dentition. Pulmonary:     Effort: Pulmonary effort is normal. No respiratory distress.  Abdominal:     General: Abdomen is flat.  Musculoskeletal:        General: Normal range of motion.     Cervical back: Neck supple.  Skin:    General: Skin is warm and dry.  Neurological:     Mental Status: She is alert and oriented to person, place, and time.  Psychiatric:        Mood and Affect: Mood normal.        Behavior: Behavior normal.        ED Results / Procedures / Treatments   Labs (all labs ordered are listed, but only abnormal results are displayed) Labs Reviewed - No data to display  EKG None   Radiology Imaging Results (Last 48 hours)  No results found.     Procedures Procedures     Medications Ordered in ED Medications  ibuprofen (ADVIL) tablet 800 mg (has no administration in time range)  acetaminophen (TYLENOL) tablet 1,000 mg (has no administration in time range)      ED Course/ Medical Decision Making/ A&P                         Medical Decision Making Risk OTC drugs. Prescription drug management.   This patient presents to the ED for concern of dental pain, this involves an extensive number of treatment options, and is a complaint that carries with it a high risk of complications and morbidity.  The differential diagnosis includes dental fracture, abscess, impaction   My initial workup includes pain medication   Additional history obtained from: Nursing notes from this visit.   Afebrile, hemodynamically stable.  26 year old female presenting to the ED for evaluation of dental pain.  This pain has been on and off for the  last 2 years, however has gotten more consistent over the last 2 weeks.  She has been unable to see a dentist due to not having a car.  Has been trying Tylenol and ibuprofen intermittently without relief of her pain.  Physical exam shows an impacted left upper molar without signs of infection.  Will treat patient's pain in the ED and strongly encouraged her to alternate Tylenol and ibuprofen on a consistent basis moving forward until she can see a dentist.  Strongly encourage patient to get into see her dentist as soon as possible.  Will send a prescription for Augmentin to cover possible infection until she is able to be seen by a dentist.  I have low suspicion for Ludwig's angina, peritonsillar or retropharyngeal abscess as her physical exam is largely benign other than the impacted tooth.  Patient began protruding drug-seeking behavior prior to discharge.  She was strongly requesting stronger pain medication as a prescription.  She even called her mother and put her on speaker phone to request that she gets stronger pain medicine after I told her that alternation of ibuprofen and Tylenol is what is best for the pain secondary to seeing a dentist.  Will send patient a prescription for diclofenac as an alternative to ibuprofen.  Gave patient strict return precautions.  Stable at discharge.   At this time there does not appear to be any evidence of an acute emergency medical condition and the patient appears stable for discharge with appropriate outpatient follow up. Diagnosis was discussed with patient who verbalizes understanding of care plan and is agreeable to discharge. I have discussed return precautions with patient who verbalizes understanding. Patient encouraged to follow-up with their PCP within 1 week. All questions answered.   Note: Portions of this report may have been transcribed using voice recognition software. Every effort was made to ensure accuracy; however, inadvertent computerized  transcription errors may still be present.           Final Clinical Impression(s) / ED Diagnoses Final diagnoses:  None      Rx / DC Orders ED Discharge Orders       None     Michelle Piper, Cordelia Poche 07/04/22 2306    Loetta Rough, MD 07/05/22 1620

## 2022-07-04 NOTE — Discharge Instructions (Addendum)
You have been seen today for your complaint of dental pain. Your discharge medications include Augmentin.  This is an antibiotic.  You should take it in the morning and at night for the next 7 days.  This may cause an upset stomach.  This is normal.  You may take it with food. Tylenol and ibuprofen.  You may take up to 800 mg of ibuprofen and up to 1000 mg of Tylenol.  You may alternate these every 4 hours for pain. Follow up with: A dentist as soon as possible.  Your pain will not stop until you are able to get into see a dentist Please seek immediate medical care if you develop any of the following symptoms: You cannot open your mouth. You are having trouble breathing or swallowing. You have a fever. Your face, neck, or jaw is swollen. At this time there does not appear to be the presence of an emergent medical condition, however there is always the potential for conditions to change. Please read and follow the below instructions.  Do not take your medicine if  develop an itchy rash, swelling in your mouth or lips, or difficulty breathing; call 911 and seek immediate emergency medical attention if this occurs.  You may review your lab tests and imaging results in their entirety on your MyChart account.  Please discuss all results of fully with your primary care provider and other specialist at your follow-up visit.  Note: Portions of this text may have been transcribed using voice recognition software. Every effort was made to ensure accuracy; however, inadvertent computerized transcription errors may still be present.

## 2023-02-05 ENCOUNTER — Encounter (HOSPITAL_COMMUNITY): Payer: Self-pay | Admitting: Emergency Medicine

## 2023-02-05 ENCOUNTER — Emergency Department (HOSPITAL_COMMUNITY)
Admission: EM | Admit: 2023-02-05 | Discharge: 2023-02-06 | Disposition: A | Discharge status: Home / Self Care | Payer: Medicaid Other | Attending: Emergency Medicine | Admitting: Emergency Medicine

## 2023-02-05 ENCOUNTER — Other Ambulatory Visit: Payer: Self-pay

## 2023-02-05 DIAGNOSIS — L568 Other specified acute skin changes due to ultraviolet radiation: Secondary | ICD-10-CM | POA: Diagnosis not present

## 2023-02-05 DIAGNOSIS — X32XXXA Exposure to sunlight, initial encounter: Secondary | ICD-10-CM | POA: Diagnosis not present

## 2023-02-05 DIAGNOSIS — R58 Hemorrhage, not elsewhere classified: Secondary | ICD-10-CM

## 2023-02-05 DIAGNOSIS — R21 Rash and other nonspecific skin eruption: Secondary | ICD-10-CM | POA: Diagnosis not present

## 2023-02-05 LAB — CBC WITH DIFFERENTIAL/PLATELET
Abs Immature Granulocytes: 0.02 10*3/uL (ref 0.00–0.07)
Basophils Absolute: 0.1 10*3/uL (ref 0.0–0.1)
Basophils Relative: 1 %
Eosinophils Absolute: 0.3 10*3/uL (ref 0.0–0.5)
Eosinophils Relative: 2 %
HCT: 37.4 % (ref 36.0–46.0)
Hemoglobin: 11.9 g/dL — ABNORMAL LOW (ref 12.0–15.0)
Immature Granulocytes: 0 %
Lymphocytes Relative: 33 %
Lymphs Abs: 3.7 10*3/uL (ref 0.7–4.0)
MCH: 28.7 pg (ref 26.0–34.0)
MCHC: 31.8 g/dL (ref 30.0–36.0)
MCV: 90.1 fL (ref 80.0–100.0)
Monocytes Absolute: 1 10*3/uL (ref 0.1–1.0)
Monocytes Relative: 9 %
Neutro Abs: 6.2 10*3/uL (ref 1.7–7.7)
Neutrophils Relative %: 55 %
Platelets: 360 10*3/uL (ref 150–400)
RBC: 4.15 MIL/uL (ref 3.87–5.11)
RDW: 15.8 % — ABNORMAL HIGH (ref 11.5–15.5)
WBC: 11.2 10*3/uL — ABNORMAL HIGH (ref 4.0–10.5)
nRBC: 0 % (ref 0.0–0.2)

## 2023-02-05 NOTE — ED Triage Notes (Addendum)
Easy bruising x 2weeks, today on inner thigh. Also experiencing petechiae appearing rash to R wrist starting Tuesday.  No daily prescribed medications. 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 Endorses loose stool, lower abd cramping (believed this is related to PMS) Denies  blood in stool

## 2023-02-06 LAB — BASIC METABOLIC PANEL
Anion gap: 6 (ref 5–15)
BUN: 8 mg/dL (ref 6–20)
CO2: 24 mmol/L (ref 22–32)
Calcium: 9 mg/dL (ref 8.9–10.3)
Chloride: 106 mmol/L (ref 98–111)
Creatinine, Ser: 0.67 mg/dL (ref 0.44–1.00)
GFR, Estimated: >60 mL/min (ref >60)
Glucose, Bld: 96 mg/dL (ref 70–99)
Potassium: 3.4 mmol/L — ABNORMAL LOW (ref 3.5–5.1)
Sodium: 136 mmol/L (ref 135–145)

## 2023-02-06 LAB — PROTIME-INR
INR: 1 (ref 0.8–1.2)
Prothrombin Time: 13.2 seconds (ref 11.4–15.2)

## 2023-02-06 NOTE — ED Provider Notes (Signed)
[place] EMERGENCY DEPARTMENT AT [facility] Provider Note   CSN: [id number] Arrival date & time: 02/05/23  2243     History  Chief Complaint  Patient presents with   Rash    Ashlee Johnson is a 27 y.o. female.  The history is provided by the patient.  Rash Location: volar wrist light brown spots onto volar forearm. 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 Timing:  Constant Progression:  Waxing and waning Chronicity:  New Context comment:  Rash on the wrist [place] Relieved by:  Nothing Worsened by:  Nothing Ineffective treatments:  None tried Associated symptoms: no fever, no tongue swelling and not wheezing   Patient was cutting limes for tacos with her dominant right arm presents with small light brown macular eruption of the volar R wrist and forearm for several days.  She has has a bruise approximately 2 cm R proximal medial thigh.      Past Medical History:  Diagnosis Date   Heart murmur    Medical history non-contributory      Home Medications Prior to Admission medications   Medication Sig Start Date End Date Taking? Authorizing Provider  amoxicillin-clavulanate (AUGMENTIN) 875-125 MG tablet Take 1 tablet by mouth every 12 (twelve) hours. 07/04/22   Schutt, Edsel Petrin, PA-C      Allergies    Percocet [oxycodone-acetaminophen]    Review of Systems   Review of Systems  Constitutional:  Negative for fever.  Eyes:  Negative for redness.  Respiratory:  Negative for wheezing.   Skin:  Positive for rash.  All other systems reviewed and are negative.   Physical Exam Updated Vital Signs BP (!) 111/58 (BP Location: Right Arm)   Pulse (!) 52   Temp 98.2 F (36.8 C) (Oral)   Resp 16   Ht 5\' 4"  (1.626 m)   Wt 72.6 kg   LMP 01/05/2023 (Approximate)   SpO2 100%   BMI 27.46  kg/m  Physical Exam Vitals and nursing note reviewed. Exam conducted with a chaperone present.  Constitutional:      General: She is not in acute distress.    Appearance: Normal appearance. She is well-developed.  HENT:     Head: Normocephalic and atraumatic.     Nose: Nose normal.  Eyes:     Pupils: Pupils are equal, round, and reactive to light.  Cardiovascular:     Rate and Rhythm: Normal rate and regular rhythm.     Pulses: Normal pulses.     Heart sounds: Normal heart sounds.  Pulmonary:     Effort: Pulmonary effort is normal. No respiratory distress.     Breath sounds: Normal breath sounds.  Abdominal:     General: Bowel sounds are normal. There is no distension.     Palpations: Abdomen is soft.     Tenderness: There is no abdominal tenderness. There is no guarding or rebound.  Genitourinary:    Vagina: No vaginal discharge.  Musculoskeletal:        General: Normal range of motion.     Cervical back: Neck supple.  Skin:    General: Skin is warm and dry.     Capillary Refill: Capillary refill takes less than 2 seconds.     Findings: No erythema or rash.       Neurological:  General: No focal deficit present.     Mental Status: She is alert.     Deep Tendon Reflexes: Reflexes normal.  Psychiatric:        Mood and Affect: Mood normal.     ED Results / Procedures / Treatments   Labs (all labs ordered are listed, but only abnormal results are displayed) Results for orders placed or performed during the hospital encounter of 02/05/23  CBC with Differential  Result Value Ref Range   WBC 11.2 (H) 4.0 - 10.5 K/uL   RBC 4.15 3.87 - 5.11 MIL/uL   Hemoglobin 11.9 (L) 12.0 - 15.0 g/dL   HCT 16.1 09.6 - 04.5 %   MCV 90.1 80.0 - 100.0 fL   MCH 28.7 26.0 - 34.0 pg   MCHC 31.8 30.0 - 36.0 g/dL   RDW 40.9 (H) 81.1 - 91.4 %   Platelets 360 150 - 400 K/uL   nRBC 0.0 0.0 - 0.2 %   Neutrophils Relative % 55 %   Neutro Abs 6.2 1.7 - 7.7 K/uL   Lymphocytes Relative 33 %    Lymphs Abs 3.7 0.7 - 4.0 K/uL   Monocytes Relative 9 %   Monocytes Absolute 1.0 0.1 - 1.0 K/uL   Eosinophils Relative 2 %   Eosinophils Absolute 0.3 0.0 - 0.5 K/uL   Basophils Relative 1 %   Basophils Absolute 0.1 0.0 - 0.1 K/uL   Immature Granulocytes 0 %   Abs Immature Granulocytes 0.02 0.00 - 0.07 K/uL  Basic metabolic panel  Result Value Ref Range   Sodium 136 135 - 145 mmol/L   Potassium 3.4 (L) 3.5 - 5.1 mmol/L   Chloride 106 98 - 111 mmol/L   CO2 24 22 - 32 mmol/L   Glucose, Bld 96 70 - 99 mg/dL   BUN 8 6 - 20 mg/dL   Creatinine, Ser 7.82 0.44 - 1.00 mg/dL   Calcium 9.0 8.9 - 95.6 mg/dL   GFR, Estimated >21 >30 mL/min   Anion gap 6 5 - 15  Protime-INR  Result Value Ref Range   Prothrombin Time 13.2 11.4 - 15.2 seconds   INR 1.0 0.8 - 1.2   No results found.  None  Radiology No results found.  Procedures Procedures    Medications Ordered in ED Medications - No data to display  ED Course/ Medical Decision Making/ A&P                             Medical Decision Making Patient with macular eruption on volar right arm and one bruise on R proximal medial thigh   Amount and/or Complexity of Data Reviewed Independent Historian: spouse    Details: See above  External Data Reviewed: notes.    Details: Previous notes reviewed  Labs: ordered.    Details: Normal PT and INR 1,  white count 11.2, hemoglobin slight low, 11.9, normal platelet count 360.  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  These are not purple and not violaceous, they are not petechiae.  I have seen and appreciate triage note but the lesions are light brown.  Bruise on the thigh is consistent with occupational injury.  No signs of ITP or coagulopathy.  Stable for discharge.      Final Clinical Impression(s) / ED Diagnoses Final diagnoses:  Photodermatitis  Ecchymosis   Return for intractable cough, coughing up blood, fevers > 100.4 unrelieved by medication, shortness of breath, intractable vomiting, chest pain, shortness of breath, weakness, numbness, changes in speech, facial asymmetry, abdominal pain, passing out, Inability to tolerate liquids or food, cough, altered mental status or any concerns. No signs of systemic illness or infection. The patient is nontoxic-appearing on exam and vital signs are within normal limits.  I have reviewed the triage vital signs and the nursing notes. Pertinent labs & imaging results that were available during my care of the patient were reviewed by me and considered in my medical decision making (see chart for details). After history, exam, and medical workup I feel the patient has been appropriately medically screened and is safe for discharge home. Pertinent diagnoses were discussed with the patient. Patient was given return precautions.  Rx / DC Orders ED Discharge Orders     None         Deny Chevez, MD 02/06/23 1610

## 2023-02-06 NOTE — ED Provider Notes (Signed)
Patient initially denied all knowledge of what could have happened to her to cause the lesions on the arm or the bruising on the medial thigh.  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     Jonise Weightman, MD 02/06/23 1610

## 2023-03-23 ENCOUNTER — Encounter (HOSPITAL_COMMUNITY): Payer: Self-pay

## 2023-03-23 ENCOUNTER — Emergency Department (HOSPITAL_COMMUNITY)
Admission: EM | Admit: 2023-03-23 | Discharge: 2023-03-23 | Disposition: A | Discharge status: Home / Self Care | Payer: Medicaid Other | Attending: Emergency Medicine | Admitting: Emergency Medicine

## 2023-03-23 ENCOUNTER — Other Ambulatory Visit: Payer: Self-pay

## 2023-03-23 DIAGNOSIS — T6591XA Toxic effect of unspecified substance, accidental (unintentional), initial encounter: Secondary | ICD-10-CM

## 2023-03-23 DIAGNOSIS — R000 Tachycardia, unspecified: Secondary | ICD-10-CM | POA: Diagnosis not present

## 2023-03-23 DIAGNOSIS — T496X1A Poisoning by otorhinolaryngological drugs and preparations, accidental (unintentional), initial encounter: Secondary | ICD-10-CM | POA: Insufficient documentation

## 2023-03-23 DIAGNOSIS — T50901A Poisoning by unspecified drugs, medicaments and biological substances, accidental (unintentional), initial encounter: Secondary | ICD-10-CM | POA: Diagnosis not present

## 2023-03-23 NOTE — Discharge Instructions (Signed)
Return for worsening or concerning symptoms. Recheck with your primary care provider.

## 2023-03-23 NOTE — ED Notes (Signed)
Pt drinking water, no vomiting.

## 2023-03-23 NOTE — ED Triage Notes (Addendum)
Pt states that she was brushing her teeth and swallowed a lot of the mouth wash and started vomiting. Pt denies SI.

## 2023-03-23 NOTE — ED Provider Notes (Signed)
[place] EMERGENCY DEPARTMENT AT [facility] Provider Note   CSN: [id number] Arrival date & time: 03/23/23  0158     History  Chief Complaint  Patient presents with   Ingestion    Ashlee Johnson is a 27 y.o. female.  27 year old female presents for vomiting after accidentally drinking mouthwash. Patient states she brushed her teeth, did not have a cup for the mouth wash so she took a swig from the grocery store size bottle and accidentally swallowed the mouthwash and then vomited. Patient called poison control and read the ingredients of the mouthwash and was then instructed to come to the ER. Denies intentionally drinking the mouthwash, denies trying to harm self or get drunk. States she doesn't feel well.        Home Medications Prior to Admission medications   Medication Sig Start Date End Date Taking? Authorizing Provider  amoxicillin-clavulanate (AUGMENTIN) 875-125 MG tablet Take 1 tablet by mouth every 12 (twelve) hours. 07/04/22   Schutt, Edsel Petrin, PA-C      Allergies    Percocet [oxycodone-acetaminophen]    Review of Systems   Review of Systems Negative except as per HPI Physical Exam Updated Vital Signs BP 104/68   Pulse 91   Temp 98 F (36.7 C) (Oral)   Resp 19   Ht 5\' 4"  (1.626 m)   Wt 72.6 kg   SpO2 100%   BMI 27.47 kg/m  Physical Exam Vitals and nursing note reviewed.  Constitutional:      General: She is not in acute distress.    Appearance: She is well-developed. She is not diaphoretic.  HENT:     Head: Normocephalic and atraumatic.  Cardiovascular:     Rate and Rhythm: Normal rate and regular rhythm.     Heart sounds: Normal heart sounds.  Pulmonary:     Effort: Pulmonary effort is normal.     Breath sounds: Normal breath sounds.  Skin:    General: Skin is warm and dry.     Findings: No erythema or rash.  Neurological:     Mental Status: She is alert and oriented to person, place, and time.  Psychiatric:        Mood and  Affect: Mood is anxious.        Behavior: Behavior normal.     ED Results / Procedures / Treatments   Labs (all labs ordered are listed, but only abnormal results are displayed) Labs Reviewed - No data to display  EKG None  Radiology No results found.  Procedures Procedures    Medications Ordered in ED Medications - No data to display  ED Course/ Medical Decision Making/ A&P                                 Medical Decision Making  27 year old female presents for evaluation of accidental ingestion of mouthwash tonight.  Patient actually swallowed a sip of mouthwash after brushing her teeth tonight.  She is somewhat gagged on the mouthwash and vomited.  Patient contacted poison control and was directed to the ER for evaluation.  On arrival, she was anxious and tachycardic.  EKG with sinus tachycardia rate of 101.  Patient was offered reassurance, remained on a cardiac monitor during observation period in emergency room.  She had no further episodes of vomiting, her heart rate normalized, vitals are stable.  She is tolerating p.o. fluids and ready for discharge.  Advised to recheck with PCP, return to ER for any worsening or concerning symptoms.        Final Clinical Impression(s) / ED Diagnoses Final diagnoses:  Ingestion of substance, accidental or unintentional, initial encounter    Rx / DC Orders ED Discharge Orders     None         Jeannie Fend, PA-C 03/23/23 0321    Shon Baton, MD 03/23/23 [phone]

## 2023-04-19 ENCOUNTER — Ambulatory Visit (INDEPENDENT_AMBULATORY_CARE_PROVIDER_SITE_OTHER): Payer: Medicaid Other | Admitting: Emergency Medicine

## 2023-04-19 VITALS — Wt 125.0 lb

## 2023-04-19 DIAGNOSIS — Z3201 Encounter for pregnancy test, result positive: Secondary | ICD-10-CM

## 2023-04-19 DIAGNOSIS — R112 Nausea with vomiting, unspecified: Secondary | ICD-10-CM | POA: Diagnosis not present

## 2023-04-19 LAB — POCT URINE PREGNANCY: Preg Test, Ur: POSITIVE — AB

## 2023-04-19 NOTE — Progress Notes (Signed)
Ashlee Johnson presents today for UPT. She has no unusual complaints and complains of nausea with vomiting for 4 days. LMP: 03/05/2023    OBJECTIVE: Appears well, in no apparent distress.  OB History     Gravida  3   Para  1   Term  1   Preterm      AB  1   Living  1      SAB  1   IAB      Ectopic      Multiple  0   Live Births  1          Home UPT Result: Positive In-Office UPT result: Positive I have reviewed the patient's medical, obstetrical, social, and family histories, and medications.   ASSESSMENT: Positive pregnancy test  PLAN Prenatal care to be completed at: [facility]  Plans to pick up PNV gummies today

## 2023-05-16 ENCOUNTER — Other Ambulatory Visit (HOSPITAL_COMMUNITY)
Admission: RE | Admit: 2023-05-16 | Discharge: 2023-05-16 | Disposition: A | Discharge status: Home / Self Care | Payer: Medicaid Other | Source: Ambulatory Visit | Attending: Obstetrics and Gynecology | Admitting: Obstetrics and Gynecology

## 2023-05-16 ENCOUNTER — Ambulatory Visit (INDEPENDENT_AMBULATORY_CARE_PROVIDER_SITE_OTHER): Payer: Medicaid Other | Admitting: *Deleted

## 2023-05-16 ENCOUNTER — Other Ambulatory Visit (INDEPENDENT_AMBULATORY_CARE_PROVIDER_SITE_OTHER): Payer: Medicaid Other

## 2023-05-16 VITALS — BP 120/63 | HR 57 | Wt 129.0 lb

## 2023-05-16 DIAGNOSIS — Z3480 Encounter for supervision of other normal pregnancy, unspecified trimester: Secondary | ICD-10-CM

## 2023-05-16 DIAGNOSIS — N898 Other specified noninflammatory disorders of vagina: Secondary | ICD-10-CM | POA: Diagnosis not present

## 2023-05-16 DIAGNOSIS — Z3A09 9 weeks gestation of pregnancy: Secondary | ICD-10-CM

## 2023-05-16 DIAGNOSIS — Z3481 Encounter for supervision of other normal pregnancy, first trimester: Secondary | ICD-10-CM

## 2023-05-16 DIAGNOSIS — Z1339 Encounter for screening examination for other mental health and behavioral disorders: Secondary | ICD-10-CM | POA: Diagnosis not present

## 2023-05-16 DIAGNOSIS — Z3A08 8 weeks gestation of pregnancy: Secondary | ICD-10-CM

## 2023-05-16 DIAGNOSIS — O26891 Other specified pregnancy related conditions, first trimester: Secondary | ICD-10-CM | POA: Insufficient documentation

## 2023-05-16 DIAGNOSIS — O3680X0 Pregnancy with inconclusive fetal viability, not applicable or unspecified: Secondary | ICD-10-CM

## 2023-05-16 MED ORDER — BLOOD PRESSURE KIT DEVI: 1.0000 | 0 refills | Status: DC

## 2023-05-16 NOTE — Progress Notes (Signed)
New OB Intake  I connected with Ashlee Johnson  on 05/16/23 at 10:15 AM EDT by In Person Visit and verified that I am speaking with the correct person using two identifiers. Nurse is located at [facility] and pt is located at [place].  I discussed the limitations, risks, security and privacy concerns of performing an evaluation and management service by telephone and the availability of in person appointments. I also discussed with the patient that there may be a patient responsible charge related to this service. The patient expressed understanding and agreed to proceed.  I explained I am completing New OB Intake today. We discussed EDD of 12/10/2023, by Last Menstrual Period. Pt is G3P1011. I reviewed her allergies, medications and Medical/Surgical/OB history.    Patient Active Problem List   Diagnosis Date Noted   Indication for care in labor and delivery, antepartum 06/18/2019   SVD (spontaneous vaginal delivery) 06/18/2019   Anemia during pregnancy 04/04/2019   Vaginal discharge during pregnancy in second trimester 01/02/2019   Supervision of normal first pregnancy, antepartum 12/10/2018    Concerns addressed today  Delivery Plans Plans to deliver at [facility]. Discussed the nature of our practice with multiple providers including residents and students. Due to the size of the practice, the delivering provider may not be the same as those providing prenatal care.   Patient is not interested in water birth. Offered upcoming OB visit with CNM to discuss further.  MyChart/Babyscripts MyChart access verified. I explained pt will have some visits in office and some virtually. Babyscripts instructions given and order placed. Patient verifies receipt of registration text/e-mail. Account successfully created and app downloaded.  Blood Pressure Cuff/Weight Scale Blood pressure cuff ordered for patient to pick-up from [employer]. Explained after first prenatal appt pt will check weekly and  document in Babyscripts. Patient does not have weight scale; patient may purchase if they desire to track weight weekly in Babyscripts.  Anatomy US Explained first scheduled [place] will be around 19 weeks. Anatomy US scheduled for TBD at TBD.  First visit review I reviewed new OB appt with patient. Explained pt will be seen by Raelyn Mora, CNM at first visit. Discussed Avelina Laine genetic screening with patient. Requests Panorama and Horizon.. Routine prenatal labs  OB Urine, GC/CC collected at today's visit.    Last Pap Diagnosis  Date Value Ref Range Status  01/02/2019   Final   NEGATIVE FOR INTRAEPITHELIAL LESIONS OR MALIGNANCY.  01/02/2019   Final   FUNGAL ORGANISMS PRESENT CONSISTENT WITH CANDIDA SPP.    Harrel Lemon, RN 05/16/2023  11:07 AM

## 2023-05-16 NOTE — Patient Instructions (Signed)

## 2023-05-17 ENCOUNTER — Other Ambulatory Visit: Payer: Self-pay

## 2023-05-17 ENCOUNTER — Telehealth: Payer: Self-pay

## 2023-05-17 DIAGNOSIS — B379 Candidiasis, unspecified: Secondary | ICD-10-CM

## 2023-05-17 DIAGNOSIS — N760 Acute vaginitis: Secondary | ICD-10-CM

## 2023-05-17 DIAGNOSIS — A749 Chlamydial infection, unspecified: Secondary | ICD-10-CM

## 2023-05-17 LAB — CERVICOVAGINAL ANCILLARY ONLY
Bacterial Vaginitis (gardnerella): POSITIVE — AB
Candida Glabrata: NEGATIVE
Candida Vaginitis: POSITIVE — AB
Chlamydia: POSITIVE — AB
Comment: NEGATIVE
Comment: NEGATIVE
Comment: NEGATIVE
Comment: NEGATIVE
Comment: NORMAL
Neisseria Gonorrhea: NEGATIVE

## 2023-05-17 MED ORDER — METRONIDAZOLE 500 MG PO TABS: 500.0000 mg | ORAL_TABLET | Freq: Two times a day (BID) | ORAL | 0 refills | Status: DC

## 2023-05-17 MED ORDER — TERCONAZOLE 0.4 % VA CREA: 1.0000 | TOPICAL_CREAM | Freq: Every day | VAGINAL | 0 refills | Status: DC

## 2023-05-17 MED ORDER — AZITHROMYCIN 500 MG PO TABS: 1000.0000 mg | ORAL_TABLET | Freq: Once | ORAL | 0 refills | Status: AC

## 2023-05-17 NOTE — Telephone Encounter (Signed)
Patient called regarding positive results in mychart. Results reviewed with patient from vaginal swab. Patient +chlamydia, bv, and yeast. Treatment has been sent to the pharmacy.  Patient advised to inform partner of the +chlamydia results as this is a sexually transmitted infection. Advised hat partner will need to be treated at the local health department or their primary care. Advised to abstain from sexual intercourse for at least a week after she and her partner has completed intercourse. Patient verbalized understanding.  [facility] form completed and faxed.

## 2023-05-18 LAB — URINE CULTURE, OB REFLEX

## 2023-05-18 LAB — CULTURE, OB URINE

## 2023-06-02 ENCOUNTER — Other Ambulatory Visit (HOSPITAL_COMMUNITY)
Admission: RE | Admit: 2023-06-02 | Discharge: 2023-06-02 | Disposition: A | Discharge status: Home / Self Care | Payer: Medicaid Other | Source: Ambulatory Visit | Attending: Obstetrics and Gynecology | Admitting: Obstetrics and Gynecology

## 2023-06-02 ENCOUNTER — Encounter: Payer: Self-pay | Admitting: Obstetrics and Gynecology

## 2023-06-02 ENCOUNTER — Ambulatory Visit (INDEPENDENT_AMBULATORY_CARE_PROVIDER_SITE_OTHER): Payer: Medicaid Other | Admitting: Obstetrics and Gynecology

## 2023-06-02 VITALS — BP 107/67 | HR 73 | Wt 130.2 lb

## 2023-06-02 DIAGNOSIS — A749 Chlamydial infection, unspecified: Secondary | ICD-10-CM

## 2023-06-02 DIAGNOSIS — Z3480 Encounter for supervision of other normal pregnancy, unspecified trimester: Secondary | ICD-10-CM

## 2023-06-02 DIAGNOSIS — Z3A11 11 weeks gestation of pregnancy: Secondary | ICD-10-CM

## 2023-06-02 DIAGNOSIS — O98811 Other maternal infectious and parasitic diseases complicating pregnancy, first trimester: Secondary | ICD-10-CM | POA: Diagnosis not present

## 2023-06-02 MED ORDER — BLOOD PRESSURE KIT DEVI: 1.0000 | 0 refills | Status: DC | PRN

## 2023-06-02 NOTE — Progress Notes (Signed)
INITIAL OBSTETRICAL VISIT Patient name: Ashlee Johnson MRN [id number]  Date of birth: [birth year] Chief Complaint:   Initial Prenatal Visit  History of Present Illness:   Ashlee Johnson is a 27 y.o. G24P1011 Caucasian female at [redacted]w[redacted]d by LMP with an Estimated Date of Delivery: 12/17/23 being seen today for her initial obstetrical visit.  Her obstetrical history is significant for  (+) CT in this pregnancy . This is a planned pregnancy. She and the father of the baby (FOB) "Tommy" live together. She has a support system that consists of FOB/her family/friends. Today she reports no complaints.   Patient's last menstrual period was 03/05/2023. Last pap 12/30/2018. Results were: normal Review of Systems:   Pertinent items are noted in HPI Denies cramping/contractions, leakage of fluid, vaginal bleeding, abnormal vaginal discharge w/ itching/odor/irritation, headaches, visual changes, shortness of breath, chest pain, abdominal pain, severe nausea/vomiting, or problems with urination or bowel movements unless otherwise stated above.  Pertinent History Reviewed:  Reviewed past medical,surgical, social, obstetrical and family history.  Reviewed problem list, medications and allergies. OB History  Gravida Para Term Preterm AB Living  3 1 1   1 1   SAB IAB Ectopic Multiple Live Births  0 1 0 0 1    # Outcome Date GA Lbr Len/2nd Weight Sex Type Anes PTL Lv  3 Current           2 Term 06/18/19 [redacted]w[redacted]d 09:40 / 02:46 6 lb 8.1 oz (2.951 kg) F Vag-Spont EPI  LIV  1 IAB 2018           Physical Assessment:   Vitals:   06/02/23 1027  BP: 107/67  Pulse: 73  Weight: 130 lb 3.2 oz (59.1 kg)  Body mass index is 22.35 kg/m.       Physical Examination:  General appearance - well appearing, and in no distress  Mental status - alert, oriented to person, place, and time  Psych:  She has a normal mood and affect  Skin - warm and dry, normal color, no suspicious lesions noted  Chest - effort normal, all  lung fields clear to auscultation bilaterally  Heart - normal rate and regular rhythm  Abdomen - soft, nontender  Extremities:  No swelling or varicosities noted  Pelvic - VULVA: normal appearing vulva with no masses, tenderness or lesions  VAGINA: normal appearing vagina with normal color and discharge, no lesions.   CERVIX: normal appearing cervix without discharge or lesions, no CMT  Thin prep pap is done with reflex HR HPV cotesting   FHTs by doppler: 154 bpm  Assessment & Plan:  1) Low-Risk Pregnancy G3P1011 at [redacted]w[redacted]d with an Estimated Date of Delivery: 12/17/23   2) Initial OB visit - Welcomed to practice and introduced self to patient in addition to discussing other advanced practice providers that she may be seeing at this practice - Congratulated patient - Anticipatory guidance on upcoming appointments - Educated on COVID19 and pregnancy and the integration of virtual appointments  - Educated on babyscripts app- patient reports she has not received email, encouraged to look in spam folder and to call office if she still has not received email - patient verbalizes understanding    3) Supervision of other normal pregnancy, antepartum - Cytology - PAP( [facility]) - PANORAMA PRENATAL TEST - [facility] Basic Panel  4) Chlamydia infection affecting pregnancy in first trimester - TOC at nv  5) [redacted] weeks gestation of pregnancy    Meds:  Meds ordered this encounter  Medications   Blood Pressure Monitoring (BLOOD PRESSURE KIT) DEVI    Sig: 1 kit by Does not apply route as needed.    Dispense:  1 each    Refill:  0    Initial labs obtained Continue prenatal vitamins Reviewed n/v relief measures and warning s/s to report Reviewed recommended weight gain based on pre-gravid BMI Encouraged well-balanced diet Genetic Screening discussed: ordered Cystic fibrosis, SMA, Fragile X screening discussed ordered The nature of [facility] with multiple  MDs and other Advanced Practice Providers was explained to patient; also emphasized that residents, students are part of our team.  Discussed optimized OB schedule and video visits. Advised can have an in-office visit whenever she feels she needs to be seen.  Does not have own BP cuff. Explained to patient that BP will be mailed to her house. Check BP weekly, report BP >140/90. Advised to call during normal business hours and there is an after-hours nurse line available.   Indications for ASA therapy (per uptodate) One of the following: Previous pregnancy with preeclampsia, especially early onset and with an adverse outcome? no Multifetal gestation? no Chronic hypertension? no Type 1 or 2 diabetes mellitus? no Chronic kidney disease? no Autoimmune disease (antiphospholipid syndrome, systemic lupus erythematosus)? no   Two or more of the following: Nulliparity? no Obesity (body mass index >30 kg/m2)? no Family history of preeclampsia in mother or sister? no Age >=35 years? no Sociodemographic characteristics (African American race, low socioeconomic level)? yes Personal risk factors (eg, previous pregnancy with low birth weight or small for gestational age infant, previous adverse pregnancy outcome [eg, stillbirth], interval >10 years between pregnancies)? no   Indications for early GDM screening  First-degree relative with diabetes? no BMI >30kg/m2? no Age > 35 years? no Previous birth of an infant weighing >=4000 g? no Gestational diabetes mellitus in a previous pregnancy? no Glycated hemoglobin >=5.7 percent (39 mmol/mol), impaired glucose tolerance, or impaired fasting glucose on previous testing? no High-risk race/ethnicity (eg, African [address], Latino, Native [address], [place] American, [place] Islander)? no Previous stillbirth of unknown cause? no Maternal birthweight > 9 lbs? no History of cardiovascular disease? no Hypertension or on therapy for hypertension? no High-density  lipoprotein cholesterol level <35 mg/dL (9.52 mmol/L) and/or a triglyceride level >250 mg/dL (8.41 mmol/L)? no Polycystic ovarian syndrome (PCOS)? no Physical inactivity? no Other clinical condition associated with insulin resistance (eg, severe obesity, acanthosis nigricans)? no Current use of glucocorticoids? no  Follow-up: Return in about 4 weeks (around 06/30/2023) for Return OB visit and TOC for (+) CT.   Orders Placed This Encounter  Procedures   [facility] PRENATAL TEST   [facility]ORIZON Basic Panel    Raelyn Mora MSN, CNM 06/02/2023 10:46 AM

## 2023-06-02 NOTE — Progress Notes (Signed)
NOB in office, intake and u/s completed on 05/16/23. Pt has no complaints today; pt states that she and her partner completed the antibiotics for +CT.

## 2023-06-02 NOTE — Patient Instructions (Signed)
 First Trimester of Pregnancy  The first trimester of pregnancy starts on the first day of your last menstrual period until the end of week 12. This is months 1 through 3 of pregnancy. A week after a sperm fertilizes an egg, the egg will implant into the wall of the uterus and begin to develop into a baby. By the end of 12 weeks, all the baby's organs will be formed and the baby will be 2-3 inches in size. Body changes during your first trimester Your body goes through many changes during pregnancy. The changes vary and generally return to normal after your baby is born. Physical changes You may gain or lose weight. Your breasts may begin to grow larger and become tender. The tissue that surrounds your nipples (areola) may become darker. Dark spots or blotches (chloasma or mask of pregnancy) may develop on your face. You may have changes in your hair. These can include thickening or thinning of your hair or changes in texture. Health changes You may feel nauseous, and you may vomit. You may have heartburn. You may develop headaches. You may develop constipation. Your gums may bleed and may be sensitive to brushing and flossing. Other changes You may tire easily. You may urinate more often. Your menstrual periods will stop. You may have a loss of appetite. You may develop cravings for certain kinds of food. You may have changes in your emotions from day to day. You may have more vivid and strange dreams. Follow these instructions at home: Medicines Follow your health care provider's instructions regarding medicine use. Specific medicines may be either safe or unsafe to take during pregnancy. Do not take any medicines unless told to by your health care provider. Take a prenatal vitamin that contains at least 600 micrograms (mcg) of folic acid. Eating and drinking Eat a healthy diet that includes fresh fruits and vegetables, whole grains, good sources of protein such as meat, eggs, or tofu,  and low-fat dairy products. Avoid raw meat and unpasteurized juice, milk, and cheese. These carry germs that can harm you and your baby. If you feel nauseous or you vomit: Eat 4 or 5 small meals a day instead of 3 large meals. Try eating a few soda crackers. Drink liquids between meals instead of during meals. You may need to take these actions to prevent or treat constipation: Drink enough fluid to keep your urine pale yellow. Eat foods that are high in fiber, such as beans, whole grains, and fresh fruits and vegetables. Limit foods that are high in fat and processed sugars, such as fried or sweet foods. Activity Exercise only as directed by your health care provider. Most people can continue their usual exercise routine during pregnancy. Try to exercise for 30 minutes at least 5 days a week. Stop exercising if you develop pain or cramping in the lower abdomen or lower back. Avoid exercising if it is very hot or humid or if you are at high altitude. Avoid heavy lifting. If you choose to, you may have sex unless your health care provider tells you not to. Relieving pain and discomfort Wear a good support bra to relieve breast tenderness. Rest with your legs elevated if you have leg cramps or low back pain. If you develop bulging veins (varicose veins) in your legs: Wear support hose as told by your health care provider. Elevate your feet for 15 minutes, 3-4 times a day. Limit salt in your diet. Safety Wear your seat belt at all times when  driving or riding in a car. Talk with your health care provider if someone is verbally or physically abusive to you. Talk with your health care provider if you are feeling sad or have thoughts of hurting yourself. Lifestyle Do not use hot tubs, steam rooms, or saunas. Do not douche. Do not use tampons or scented sanitary pads. Do not use herbal remedies, alcohol, illegal drugs, or medicines that are not approved by your health care provider. Chemicals  in these products can harm your baby. Do not use any products that contain nicotine or tobacco, such as cigarettes, e-cigarettes, and chewing tobacco. If you need help quitting, ask your health care provider. Avoid cat litter boxes and soil used by cats. These carry germs that can cause birth defects in the baby and possibly loss of the unborn baby (fetus) by miscarriage or stillbirth. General instructions During routine prenatal visits in the first trimester, your health care provider will do a physical exam, perform necessary tests, and ask you how things are going. Keep all follow-up visits. This is important. Ask for help if you have counseling or nutritional needs during pregnancy. Your health care provider can offer advice or refer you to specialists for help with various needs. Schedule a dentist appointment. At home, brush your teeth with a soft toothbrush. Floss gently. Write down your questions. Take them to your prenatal visits. Where to find more information American Pregnancy Association: americanpregnancy.org [employer] of [employer]st[employer]ians and Gynecologists: https://www.todd-brady.net/ Office on [employer] Health: MightyReward.[employer].nz Contact a health care provider if you have: Dizziness. A fever. Mild pelvic cramps, pelvic pressure, or nagging pain in the abdominal area. Nausea, vomiting, or diarrhea that lasts for 24 hours or longer. A bad-smelling vaginal discharge. Pain when you urinate. Known exposure to a contagious illness, such as chickenpox, measles, Zika virus, HIV, or hepatitis. Get help right away if you have: Spotting or bleeding from your vagina. Severe abdominal cramping or pain. Shortness of breath or chest pain. Any kind of trauma, such as from a fall or a car crash. New or increased pain, swelling, or redness in an arm or leg. Summary The first trimester of pregnancy starts on the first day of your last menstrual period until the end of week  12 (months 1 through 3). Eating 4 or 5 small meals a day rather than 3 large meals may help to relieve nausea and vomiting. Do not use any products that contain nicotine or tobacco, such as cigarettes, e-cigarettes, and chewing tobacco. If you need help quitting, ask your health care provider. Keep all follow-up visits. This is important. This information is not intended to replace advice given to you by your health care provider. Make sure you discuss any questions you have with your health care provider. Document Revised: 01/01/2020 Document Reviewed: 11/07/2019 Elsevier Patient Education  2024 [employer].

## 2023-06-03 LAB — CBC/D/PLT+RPR+RH+ABO+RUBIGG...
Antibody Screen: NEGATIVE
Basophils Absolute: 0 10*3/uL (ref 0.0–0.2)
Basos: 0 %
EOS (ABSOLUTE): 0.1 10*3/uL (ref 0.0–0.4)
Eos: 1 %
HCV Ab: NONREACTIVE
HIV Screen 4th Generation wRfx: NONREACTIVE
Hematocrit: 37.3 % (ref 34.0–46.6)
Hemoglobin: 12.2 g/dL (ref 11.1–15.9)
Hepatitis B Surface Ag: NEGATIVE
Immature Grans (Abs): 0 10*3/uL (ref 0.0–0.1)
Immature Granulocytes: 0 %
Lymphocytes Absolute: 1.9 10*3/uL (ref 0.7–3.1)
Lymphs: 21 %
MCH: 29.3 pg (ref 26.6–33.0)
MCHC: 32.7 g/dL (ref 31.5–35.7)
MCV: 90 fL (ref 79–97)
Monocytes Absolute: 0.7 10*3/uL (ref 0.1–0.9)
Monocytes: 7 %
Neutrophils Absolute: 6.6 10*3/uL (ref 1.4–7.0)
Neutrophils: 71 %
Platelets: 274 10*3/uL (ref 150–450)
RBC: 4.16 x10E6/uL (ref 3.77–5.28)
RDW: 13.9 % (ref 11.7–15.4)
RPR Ser Ql: NONREACTIVE
Rh Factor: POSITIVE
Rubella Antibodies, IGG: 1.76 {index} (ref >0.99)
WBC: 9.3 10*3/uL (ref 3.4–10.8)

## 2023-06-03 LAB — HCV INTERPRETATION

## 2023-06-07 LAB — CYTOLOGY - PAP
Comment: NEGATIVE
Diagnosis: REACTIVE
High risk HPV: NEGATIVE

## 2023-06-09 ENCOUNTER — Encounter: Payer: Self-pay | Admitting: Obstetrics and Gynecology

## 2023-06-11 LAB — PANORAMA PRENATAL TEST FULL PANEL:PANORAMA TEST PLUS 5 ADDITIONAL MICRODELETIONS: FETAL FRACTION: 9.1

## 2023-06-13 ENCOUNTER — Encounter: Payer: Self-pay | Admitting: Obstetrics and Gynecology

## 2023-06-17 LAB — HORIZON CUSTOM: REPORT SUMMARY: NEGATIVE

## 2023-06-30 ENCOUNTER — Ambulatory Visit (INDEPENDENT_AMBULATORY_CARE_PROVIDER_SITE_OTHER): Payer: Medicaid Other | Admitting: Certified Nurse Midwife

## 2023-06-30 ENCOUNTER — Other Ambulatory Visit (HOSPITAL_COMMUNITY)
Admission: RE | Admit: 2023-06-30 | Discharge: 2023-06-30 | Disposition: A | Discharge status: Home / Self Care | Payer: Medicaid Other | Source: Ambulatory Visit | Attending: Certified Nurse Midwife | Admitting: Certified Nurse Midwife

## 2023-06-30 VITALS — BP 106/60 | HR 77 | Wt 137.0 lb

## 2023-06-30 DIAGNOSIS — A749 Chlamydial infection, unspecified: Secondary | ICD-10-CM

## 2023-06-30 DIAGNOSIS — L659 Nonscarring hair loss, unspecified: Secondary | ICD-10-CM | POA: Insufficient documentation

## 2023-06-30 DIAGNOSIS — Z3402 Encounter for supervision of normal first pregnancy, second trimester: Secondary | ICD-10-CM | POA: Diagnosis not present

## 2023-06-30 DIAGNOSIS — Z3A15 15 weeks gestation of pregnancy: Secondary | ICD-10-CM | POA: Diagnosis not present

## 2023-06-30 NOTE — Progress Notes (Unsigned)
Pt presents for ROB visit. Pt c/o lower abd cramping.

## 2023-07-02 LAB — AFP, SERUM, OPEN SPINA BIFIDA
AFP MoM: 0.97
AFP Value: 29.8 ng/mL
Gest. Age on Collection Date: 15 wk
Maternal Age At EDD: 26.4 a
OSBR Risk 1 IN: 10000
Test Results:: NEGATIVE
Weight: 137 [lb_av]

## 2023-07-03 LAB — CERVICOVAGINAL ANCILLARY ONLY
Bacterial Vaginitis (gardnerella): NEGATIVE
Candida Glabrata: NEGATIVE
Candida Vaginitis: POSITIVE — AB
Chlamydia: NEGATIVE
Comment: NEGATIVE
Comment: NEGATIVE
Comment: NEGATIVE
Comment: NEGATIVE
Comment: NEGATIVE
Comment: NORMAL
Neisseria Gonorrhea: NEGATIVE
Trichomonas: NEGATIVE

## 2023-07-03 MED ORDER — TERCONAZOLE 0.4 % VA CREA: 1.0000 | TOPICAL_CREAM | Freq: Every day | VAGINAL | 0 refills | Status: DC

## 2023-07-03 NOTE — Progress Notes (Signed)
   PRENATAL VISIT NOTE  Subjective:  Ashlee Johnson is a 27 y.o. G3P1011 at [redacted]w[redacted]d being seen today for ongoing prenatal care.  She is currently monitored for the following issues for this low-risk pregnancy and has Supervision of other normal pregnancy, antepartum on their problem list.  Patient reports no complaints.  Contractions: Not present. Vag. Bleeding: None.  Movement: Absent. Denies leaking of fluid.   The following portions of the patient's history were reviewed and updated as appropriate: allergies, current medications, past family history, past medical history, past social history, past surgical history and problem list.   Objective:   Vitals:   06/30/23 1042  BP: 106/60  Pulse: 77  Weight: 137 lb (62.1 kg)    Fetal Status: Fetal Heart Rate (bpm): 148   Movement: Absent     General:  Alert, oriented and cooperative. Patient is in no acute distress.  Skin: Skin is warm and dry. No rash noted.   Cardiovascular: Normal heart rate noted  Respiratory: Normal respiratory effort, no problems with respiration noted  Abdomen: Soft, gravid, appropriate for gestational age.  Pain/Pressure: Present     Pelvic: Cervical exam deferred        Extremities: Normal range of motion.  Edema: None  Mental Status: Normal mood and affect. Normal behavior. Normal judgment and thought content.   Assessment and Plan:  Pregnancy: G3P1011 at [redacted]w[redacted]d 1. Encounter for supervision of low-risk first pregnancy in second trimester - Patient overall doing well.   2. [redacted] weeks gestation of pregnancy - AFP, Serum, Open Spina Bifida - Cervicovaginal ancillary only( [place])  3. Positive Chlamyida test - TOC collected today.  4. Balding - Patient denies recent trauma or new hair product to chemical. - Ambulatory referral to Dermatology  Preterm labor symptoms and general obstetric precautions including but not limited to vaginal bleeding, contractions, leaking of fluid and fetal movement were  reviewed in detail with the patient. Please refer to After Visit Summary for other counseling recommendations.   No follow-ups on file.  Future Appointments  Date Time Provider Department Center  07/24/2023  1:15 PM [facility] NURSE [facility]  07/24/2023  1:30 PM [facility] US1 [facility]  07/28/2023 10:35 AM Constant, Gigi Gin, MD [facility] None  08/25/2023 10:35 AM Allie Bossier, MD [facility] None    Lakiesha Ralphs Danella Deis) Suzie Portela, MSN, CNM  Center for [facility]  07/03/2023 4:11 PM

## 2023-07-03 NOTE — Addendum Note (Signed)
Addended by: Carlynn Herald on: 07/03/2023 04:44 PM   Modules accepted: Orders

## 2023-07-24 ENCOUNTER — Ambulatory Visit: Payer: Medicaid Other | Admitting: *Deleted

## 2023-07-24 ENCOUNTER — Encounter: Payer: Self-pay | Admitting: *Deleted

## 2023-07-24 ENCOUNTER — Ambulatory Visit: Payer: Medicaid Other

## 2023-07-24 ENCOUNTER — Ambulatory Visit: Payer: Medicaid Other | Attending: Obstetrics & Gynecology

## 2023-07-24 VITALS — BP 106/53 | HR 74

## 2023-07-24 DIAGNOSIS — Z3480 Encounter for supervision of other normal pregnancy, unspecified trimester: Secondary | ICD-10-CM | POA: Diagnosis not present

## 2023-07-24 DIAGNOSIS — Z3A19 19 weeks gestation of pregnancy: Secondary | ICD-10-CM | POA: Insufficient documentation

## 2023-07-24 DIAGNOSIS — Z3482 Encounter for supervision of other normal pregnancy, second trimester: Secondary | ICD-10-CM | POA: Insufficient documentation

## 2023-07-24 DIAGNOSIS — Z3689 Encounter for other specified antenatal screening: Secondary | ICD-10-CM | POA: Insufficient documentation

## 2023-07-24 DIAGNOSIS — Z363 Encounter for antenatal screening for malformations: Secondary | ICD-10-CM | POA: Diagnosis not present

## 2023-07-28 ENCOUNTER — Encounter: Payer: Self-pay | Admitting: Obstetrics and Gynecology

## 2023-07-28 ENCOUNTER — Other Ambulatory Visit (HOSPITAL_COMMUNITY)
Admission: RE | Admit: 2023-07-28 | Discharge: 2023-07-28 | Disposition: A | Discharge status: Home / Self Care | Payer: Medicaid Other | Source: Ambulatory Visit | Attending: Obstetrics and Gynecology | Admitting: Obstetrics and Gynecology

## 2023-07-28 ENCOUNTER — Ambulatory Visit (INDEPENDENT_AMBULATORY_CARE_PROVIDER_SITE_OTHER): Payer: Medicaid Other | Admitting: Obstetrics and Gynecology

## 2023-07-28 VITALS — BP 110/67 | HR 89 | Wt 143.0 lb

## 2023-07-28 DIAGNOSIS — Z3A19 19 weeks gestation of pregnancy: Secondary | ICD-10-CM | POA: Diagnosis not present

## 2023-07-28 DIAGNOSIS — Z3480 Encounter for supervision of other normal pregnancy, unspecified trimester: Secondary | ICD-10-CM | POA: Insufficient documentation

## 2023-07-28 DIAGNOSIS — Z3482 Encounter for supervision of other normal pregnancy, second trimester: Secondary | ICD-10-CM | POA: Diagnosis not present

## 2023-07-28 NOTE — Progress Notes (Signed)
   PRENATAL VISIT NOTE  Subjective:  Ashlee Johnson is a 27 y.o. G3P1011 at [redacted]w[redacted]d being seen today for ongoing prenatal care.  She is currently monitored for the following issues for this low-risk pregnancy and has Supervision of other normal pregnancy, antepartum on their problem list.  Patient reports vaginal irritation.  Contractions: Not present. Vag. Bleeding: None.  Movement: Present. Denies leaking of fluid.   The following portions of the patient's history were reviewed and updated as appropriate: allergies, current medications, past family history, past medical history, past social history, past surgical history and problem list.   Objective:   Vitals:   07/28/23 1056  BP: 110/67  Pulse: 89  Weight: 143 lb (64.9 kg)    Fetal Status: Fetal Heart Rate (bpm): 140   Movement: Present     General:  Alert, oriented and cooperative. Patient is in no acute distress.  Skin: Skin is warm and dry. No rash noted.   Cardiovascular: Normal heart rate noted  Respiratory: Normal respiratory effort, no problems with respiration noted  Abdomen: Soft, gravid, appropriate for gestational age.  Pain/Pressure: Absent     Pelvic: Cervical exam deferred        Extremities: Normal range of motion.     Mental Status: Normal mood and affect. Normal behavior. Normal judgment and thought content.   Assessment and Plan:  Pregnancy: G3P1011 at [redacted]w[redacted]d 1. Supervision of other normal pregnancy, antepartum (Primary) Patient is doing well without complaints Self swab collected Anatomy ultrasound reviewed  Preterm labor symptoms and general obstetric precautions including but not limited to vaginal bleeding, contractions, leaking of fluid and fetal movement were reviewed in detail with the patient. Please refer to After Visit Summary for other counseling recommendations.   Return in about 4 weeks (around 08/25/2023) for in person, ROB, Low risk.  Future Appointments  Date Time Provider Department Center   08/25/2023 10:35 AM Allie Bossier, MD [facility] None  12/14/2023  1:00 PM Terri Piedra, DO [facility] None    Catalina Antigua, MD

## 2023-07-28 NOTE — Progress Notes (Signed)
Pt was recently treated for yeast, pt states still having symptoms.  Pt will do self swab today.

## 2023-08-01 LAB — CERVICOVAGINAL ANCILLARY ONLY
Bacterial Vaginitis (gardnerella): NEGATIVE
Candida Glabrata: NEGATIVE
Candida Vaginitis: POSITIVE — AB
Comment: NEGATIVE
Comment: NEGATIVE
Comment: NEGATIVE

## 2023-08-03 ENCOUNTER — Other Ambulatory Visit: Payer: Self-pay | Admitting: Obstetrics and Gynecology

## 2023-08-03 MED ORDER — TERCONAZOLE 0.8 % VA CREA: 1.0000 | TOPICAL_CREAM | Freq: Every day | VAGINAL | 0 refills | Status: DC

## 2023-08-03 NOTE — Addendum Note (Signed)
Addended by: Catalina Antigua on: 08/03/2023 05:06 PM   Modules accepted: Orders

## 2023-08-09 NOTE — L&D Delivery Note (Signed)
 OB/GYN Faculty Practice Delivery Note  Ashlee Johnson is a 28 y.o. G3P1011 s/p NSVD at [redacted]w[redacted]d. She was admitted for SOL.   ROM: 2h 73m with clear fluid GBS Status: Negative/-- (04/23 1509) Maximum Maternal Temperature: Temp (24hrs), Avg:98 F (36.7 C), Min:97.7 F (36.5 C), Max:98.2 F (36.8 C)   Labor Progress: Initial SVE: 4.5/90/-2.  No augmentation  required. SROM occurred. She then progressed to complete.   Delivery Date/Time: 12/09/23 2314 Delivery: Called to room and patient was complete and feeling urge to push. Pushed with two contractions. Head delivered OA to LOA. No nuchal cord present. Compound right hand noted; shoulders and body delivered without difficulty with maternal effort. Infant with spontaneous cry, placed on mother's abdomen, dried and stimulated. Cord clamped x 2 after +1-minute delay, and cut by FOB. Cord gases not obtained. Cord blood drawn. Placenta delivered spontaneously with gentle cord traction. Trailing membranes noted and were removed with ring forceps. Fundus firm with massage and Pitocin . Labia, perineum, and vagina inspected with  hemostatic 1st degree perineal and right labial . Labial tear was bleeding and repaired in a running fashion with 4-0 vicryl for hemostasis. Mom and baby to postpartum. Baby Weight: pending  Placenta: 3 vessel, intact. Sent to L&D Complications: None Lacerations: 1st degree perineal, right labial EBL: 50 mL Anesthesia: epidural  Infant: baby girl Remii APGAR (1 MIN): 9  APGAR (5 MINS): 9  APGAR (10 MINS):    Maud Sorenson, MD [facility] Fellow, [facility] for [facility], [facility] 12/09/2023, 11:32 PM

## 2023-08-25 ENCOUNTER — Encounter: Payer: Medicaid Other | Admitting: Obstetrics & Gynecology

## 2023-08-30 ENCOUNTER — Ambulatory Visit (INDEPENDENT_AMBULATORY_CARE_PROVIDER_SITE_OTHER): Payer: Medicaid Other | Admitting: Obstetrics & Gynecology

## 2023-08-30 VITALS — BP 103/67 | HR 86 | Wt 150.0 lb

## 2023-08-30 DIAGNOSIS — Z3480 Encounter for supervision of other normal pregnancy, unspecified trimester: Secondary | ICD-10-CM

## 2023-08-30 NOTE — Progress Notes (Signed)
Pt states she was recently tx for yeast, still having symptoms.

## 2023-08-30 NOTE — Progress Notes (Signed)
   PRENATAL VISIT NOTE  Subjective:  Ashlee Johnson is a 28 y.o. G3P1011 at [redacted]w[redacted]d being seen today for ongoing prenatal care.  She is currently monitored for the following issues for this low-risk pregnancy and has Supervision of other normal pregnancy, antepartum on their problem list.  Patient reports no complaints.  Contractions: Not present. Vag. Bleeding: None.  Movement: Present. Denies leaking of fluid.   The following portions of the patient's history were reviewed and updated as appropriate: allergies, current medications, past family history, past medical history, past social history, past surgical history and problem list.   Objective:   Vitals:   08/30/23 1351  BP: 103/67  Pulse: 86  Weight: 150 lb (68 kg)    Fetal Status: Fetal Heart Rate (bpm): 140   Movement: Present     General:  Alert, oriented and cooperative. Patient is in no acute distress.  Skin: Skin is warm and dry. No rash noted.   Cardiovascular: Normal heart rate noted  Respiratory: Normal respiratory effort, no problems with respiration noted  Abdomen: Soft, gravid, appropriate for gestational age.  Pain/Pressure: Absent     Pelvic: Cervical exam deferred        Extremities: Normal range of motion.     Mental Status: Normal mood and affect. Normal behavior. Normal judgment and thought content.   Assessment and Plan:  Pregnancy: G3P1011 at [redacted]w[redacted]d 1. Supervision of other normal pregnancy, antepartum (Primary) F/u growth 32 weeks - [place] MFM OB FOLLOW UP; Future  Preterm labor symptoms and general obstetric precautions including but not limited to vaginal bleeding, contractions, leaking of fluid and fetal movement were reviewed in detail with the patient. Please refer to After Visit Summary for other counseling recommendations.   Return in about 4 weeks (around 09/27/2023).  Future Appointments  Date Time Provider Department Center  09/27/2023  9:15 AM [facility] None  09/27/2023  9:35 AM Sue Lush, FNP [facility] None  12/14/2023  1:00 PM Terri Piedra, DO [facility] None    Scheryl Darter, MD

## 2023-08-31 ENCOUNTER — Ambulatory Visit: Payer: Medicaid Other

## 2023-09-26 NOTE — Progress Notes (Unsigned)
   PRENATAL VISIT NOTE  Subjective:  Ashlee Johnson is a 28 y.o. G3P1011 at [redacted]w[redacted]d being seen today for ongoing prenatal care.  She is currently monitored for the following issues for this low-risk pregnancy and has Supervision of other normal pregnancy, antepartum on their problem list.  Patient reports {[id number]}.   .  .   . Denies leaking of fluid.   The following portions of the patient's history were reviewed and updated as appropriate: allergies, current medications, past family history, past medical history, past social history, past surgical history and problem list.   Objective:  There were no vitals filed for this visit.  Fetal Status:           General:  Alert, oriented and cooperative. Patient is in no acute distress.  Skin: Skin is warm and dry. No rash noted.   Cardiovascular: Normal heart rate noted  Respiratory: Normal respiratory effort, no problems with respiration noted  Abdomen: Soft, gravid, appropriate for gestational age.        Pelvic: Cervical exam deferred        Extremities: Normal range of motion.     Mental Status: Normal mood and affect. Normal behavior. Normal judgment and thought content.   Assessment and Plan:  Pregnancy: G3P1011 at [redacted]w[redacted]d 1. Supervision of other normal pregnancy, antepartum (Primary) ***  2. [redacted] weeks gestation of pregnancy GTT and labs today   Preterm labor symptoms and general obstetric precautions including but not limited to vaginal bleeding, contractions, leaking of fluid and fetal movement were reviewed in detail with the patient. Please refer to After Visit Summary for other counseling recommendations.   No follow-ups on file.  Future Appointments  Date Time Provider Department Center  09/27/2023  9:15 AM [facility] LAB [facility] None  09/27/2023  9:35 AM Sue Lush, FNP [facility] None  10/26/2023 11:15 AM [facility] NURSE [facility]  10/26/2023 11:30 AM [facility] US1 [facility]  12/14/2023  1:00 PM Terri Piedra, DO  [facility] None    Albertine Grates, FNP

## 2023-09-27 ENCOUNTER — Ambulatory Visit (INDEPENDENT_AMBULATORY_CARE_PROVIDER_SITE_OTHER): Payer: Medicaid Other | Admitting: Obstetrics and Gynecology

## 2023-09-27 ENCOUNTER — Other Ambulatory Visit: Payer: Medicaid Other

## 2023-09-27 VITALS — BP 124/64 | HR 87 | Wt 154.4 lb

## 2023-09-27 DIAGNOSIS — R12 Heartburn: Secondary | ICD-10-CM | POA: Diagnosis not present

## 2023-09-27 DIAGNOSIS — N898 Other specified noninflammatory disorders of vagina: Secondary | ICD-10-CM | POA: Diagnosis not present

## 2023-09-27 DIAGNOSIS — Z3A28 28 weeks gestation of pregnancy: Secondary | ICD-10-CM | POA: Diagnosis not present

## 2023-09-27 DIAGNOSIS — O26893 Other specified pregnancy related conditions, third trimester: Secondary | ICD-10-CM

## 2023-09-27 DIAGNOSIS — Z3480 Encounter for supervision of other normal pregnancy, unspecified trimester: Secondary | ICD-10-CM

## 2023-09-27 MED ORDER — PANTOPRAZOLE SODIUM 40 MG PO TBEC
40.0000 mg | DELAYED_RELEASE_TABLET | Freq: Every evening | ORAL | 1 refills | Status: DC
Start: 2023-09-27 — End: 2024-06-06

## 2023-09-27 MED ORDER — FLUCONAZOLE 150 MG PO TABS: 150.0000 mg | ORAL_TABLET | Freq: Once | ORAL | 1 refills | Status: AC

## 2023-09-27 NOTE — Progress Notes (Signed)
 Severe heartburn. Hurts to lay down at night. Started 2wks ago. Tried Pepcid and Tums. No relief. Plans T-dap next visit. Had yeast infection. Took Terazol but didn't work. On abx now for planned dental work.

## 2023-09-28 LAB — CBC
Hematocrit: 31 % — ABNORMAL LOW (ref 34.0–46.6)
Hemoglobin: 10.2 g/dL — ABNORMAL LOW (ref 11.1–15.9)
MCH: 29.9 pg (ref 26.6–33.0)
MCHC: 32.9 g/dL (ref 31.5–35.7)
MCV: 91 fL (ref 79–97)
Platelets: 201 10*3/uL (ref 150–450)
RBC: 3.41 x10E6/uL — ABNORMAL LOW (ref 3.77–5.28)
RDW: 12.7 % (ref 11.7–15.4)
WBC: 10.4 10*3/uL (ref 3.4–10.8)

## 2023-09-28 LAB — GLUCOSE TOLERANCE, 2 HOURS W/ 1HR
Glucose, 1 hour: 122 mg/dL (ref 70–179)
Glucose, 2 hour: 80 mg/dL (ref 70–152)
Glucose, Fasting: 65 mg/dL — ABNORMAL LOW (ref 70–91)

## 2023-09-28 LAB — HIV ANTIBODY (ROUTINE TESTING W REFLEX): HIV Screen 4th Generation wRfx: NONREACTIVE

## 2023-09-28 LAB — RPR: RPR Ser Ql: NONREACTIVE

## 2023-10-02 MED ORDER — FERROUS SULFATE 325 (65 FE) MG PO TABS: 325.0000 mg | ORAL_TABLET | Freq: Every day | ORAL | 1 refills | Status: DC

## 2023-10-02 NOTE — Addendum Note (Signed)
 Addended by: Sue Lush on: 10/02/2023 12:07 PM   Modules accepted: Orders

## 2023-10-04 ENCOUNTER — Telehealth: Payer: Self-pay

## 2023-10-04 NOTE — Telephone Encounter (Signed)
 R/S APPT

## 2023-10-11 ENCOUNTER — Ambulatory Visit: Payer: Medicaid Other | Admitting: Obstetrics and Gynecology

## 2023-10-11 ENCOUNTER — Encounter: Payer: Self-pay | Admitting: Obstetrics and Gynecology

## 2023-10-11 VITALS — BP 121/72 | HR 99 | Wt 161.2 lb

## 2023-10-11 DIAGNOSIS — Z23 Encounter for immunization: Secondary | ICD-10-CM

## 2023-10-11 DIAGNOSIS — Z3480 Encounter for supervision of other normal pregnancy, unspecified trimester: Secondary | ICD-10-CM

## 2023-10-11 DIAGNOSIS — Z3A30 30 weeks gestation of pregnancy: Secondary | ICD-10-CM | POA: Diagnosis not present

## 2023-10-11 NOTE — Progress Notes (Signed)
 Pt presents for ROB visit. No concerns

## 2023-10-11 NOTE — Progress Notes (Signed)
   PRENATAL VISIT NOTE  Subjective:  Ashlee Johnson is a 28 y.o. G3P1011 at [redacted]w[redacted]d being seen today for ongoing prenatal care.  She is currently monitored for the following issues for this low-risk pregnancy and has Supervision of other normal pregnancy, antepartum on their problem list.  Patient reports no complaints.  Contractions: Not present. Vag. Bleeding: None.  Movement: Present. Denies leaking of fluid.   The following portions of the patient's history were reviewed and updated as appropriate: allergies, current medications, past family history, past medical history, past social history, past surgical history and problem list.   Objective:   Vitals:   10/11/23 1040  BP: 121/72  Pulse: 99  Weight: 161 lb 3.2 oz (73.1 kg)    Fetal Status: Fetal Heart Rate (bpm): 131 Fundal Height: 30 cm Movement: Present     General:  Alert, oriented and cooperative. Patient is in no acute distress.  Skin: Skin is warm and dry. No rash noted.   Cardiovascular: Normal heart rate noted  Respiratory: Normal respiratory effort, no problems with respiration noted  Abdomen: Soft, gravid, appropriate for gestational age.  Pain/Pressure: Absent     Pelvic: Cervical exam deferred        Extremities: Normal range of motion.  Edema: None  Mental Status: Normal mood and affect. Normal behavior. Normal judgment and thought content.   Assessment and Plan:  Pregnancy: G3P1011 at [redacted]w[redacted]d 1. Supervision of other normal pregnancy, antepartum (Primary) BP and FHR normal Doing well, feeling regular movement    2. [redacted] weeks gestation of pregnancy Tdap today    Preterm labor symptoms and general obstetric precautions including but not limited to vaginal bleeding, contractions, leaking of fluid and fetal movement were reviewed in detail with the patient. Please refer to After Visit Summary for other counseling recommendations.   Return in about 2 weeks (around 10/25/2023) for OB VISIT (MD or APP).  Future  Appointments  Date Time Provider Department Center  10/30/2023 10:15 AM [facility] NURSE [facility]  10/30/2023 10:30 AM [facility] US1 [facility]  12/14/2023  1:00 PM Terri Piedra, DO [facility] None    Albertine Grates, FNP

## 2023-10-11 NOTE — Addendum Note (Signed)
 Addended by: Jearld Adjutant on: 10/11/2023 11:14 AM   Modules accepted: Orders

## 2023-10-25 ENCOUNTER — Encounter: Payer: Self-pay | Admitting: Obstetrics and Gynecology

## 2023-10-25 ENCOUNTER — Ambulatory Visit: Admitting: Obstetrics and Gynecology

## 2023-10-25 VITALS — BP 102/59 | HR 84 | Wt 165.0 lb

## 2023-10-25 DIAGNOSIS — Z3A32 32 weeks gestation of pregnancy: Secondary | ICD-10-CM

## 2023-10-25 DIAGNOSIS — Z3480 Encounter for supervision of other normal pregnancy, unspecified trimester: Secondary | ICD-10-CM

## 2023-10-25 NOTE — Progress Notes (Signed)
 Pt presents for ROB visit. No concerns

## 2023-10-25 NOTE — Progress Notes (Signed)
   PRENATAL VISIT NOTE  Subjective:  Ashlee Johnson is a 28 y.o. G3P1011 at [redacted]w[redacted]d being seen today for ongoing prenatal care.  She is currently monitored for the following issues for this low-risk pregnancy and has Supervision of other normal pregnancy, antepartum on their problem list.  Patient reports  bilateral hip aching .  Contractions: Not present. Vag. Bleeding: None.  Movement: Present. Denies leaking of fluid.   The following portions of the patient's history were reviewed and updated as appropriate: allergies, current medications, past family history, past medical history, past social history, past surgical history and problem list.   Objective:   Vitals:   10/25/23 0946  BP: (!) 102/59  Pulse: 84  Weight: 165 lb (74.8 kg)    Fetal Status: Fetal Heart Rate (bpm): 150 Fundal Height: 31 cm Movement: Present     General:  Alert, oriented and cooperative. Patient is in no acute distress.  Skin: Skin is warm and dry. No rash noted.   Cardiovascular: Normal heart rate noted  Respiratory: Normal respiratory effort, no problems with respiration noted  Abdomen: Soft, gravid, appropriate for gestational age.  Pain/Pressure: Present     Pelvic: Cervical exam deferred        Extremities: Normal range of motion.  Edema: None  Mental Status: Normal mood and affect. Normal behavior. Normal judgment and thought content.   Assessment and Plan:  Pregnancy: G3P1011 at [redacted]w[redacted]d 1. Supervision of other normal pregnancy, antepartum (Primary) - BP low-normal and FHR normal - Doing well, feeling regular movement  - Encouraged to monitor hip pain, may take Tylenol for pain or do stretches.   2. [redacted] weeks gestation of pregnancy - Fundal height consistent with gestational age  Preterm labor symptoms and general obstetric precautions including but not limited to vaginal bleeding, contractions, leaking of fluid and fetal movement were reviewed in detail with the patient. Please refer to After Visit  Summary for other counseling recommendations.   Return in about 2 weeks (around 11/08/2023) for routine prenatal care.  Future Appointments  Date Time Provider Department Center  10/30/2023 10:15 AM [facility] NURSE [facility]  10/30/2023 10:30 AM [facility] US1 [facility]  11/08/2023 10:55 AM Warden Fillers, MD [facility] None  12/14/2023  1:00 PM Terri Piedra, DO [facility] None    Melida Quitter, [place]

## 2023-10-26 ENCOUNTER — Ambulatory Visit: Payer: Medicaid Other

## 2023-10-30 ENCOUNTER — Ambulatory Visit: Payer: Medicaid Other | Attending: Obstetrics and Gynecology | Admitting: *Deleted

## 2023-10-30 ENCOUNTER — Ambulatory Visit (HOSPITAL_BASED_OUTPATIENT_CLINIC_OR_DEPARTMENT_OTHER): Payer: Medicaid Other

## 2023-10-30 VITALS — BP 116/56 | HR 74

## 2023-10-30 DIAGNOSIS — Z3480 Encounter for supervision of other normal pregnancy, unspecified trimester: Secondary | ICD-10-CM

## 2023-10-30 DIAGNOSIS — Z3A33 33 weeks gestation of pregnancy: Secondary | ICD-10-CM | POA: Diagnosis not present

## 2023-10-30 DIAGNOSIS — O26843 Uterine size-date discrepancy, third trimester: Secondary | ICD-10-CM | POA: Diagnosis not present

## 2023-10-30 DIAGNOSIS — Z3689 Encounter for other specified antenatal screening: Secondary | ICD-10-CM

## 2023-11-08 ENCOUNTER — Ambulatory Visit (INDEPENDENT_AMBULATORY_CARE_PROVIDER_SITE_OTHER): Admitting: Obstetrics and Gynecology

## 2023-11-08 VITALS — BP 107/66 | HR 86 | Wt 168.0 lb

## 2023-11-08 DIAGNOSIS — Z3A34 34 weeks gestation of pregnancy: Secondary | ICD-10-CM

## 2023-11-08 DIAGNOSIS — Z3480 Encounter for supervision of other normal pregnancy, unspecified trimester: Secondary | ICD-10-CM | POA: Diagnosis not present

## 2023-11-08 NOTE — Progress Notes (Signed)
   PRENATAL VISIT NOTE  Subjective:  Ashlee Johnson is a 28 y.o. G3P1011 at [redacted]w[redacted]d being seen today for ongoing prenatal care.  She is currently monitored for the following issues for this low-risk pregnancy and has Supervision of other normal pregnancy, antepartum on their problem list.  Patient doing well with no acute concerns today. She reports no complaints.  Contractions: Not present. Vag. Bleeding: None.  Movement: Present. Denies leaking of fluid.   The following portions of the patient's history were reviewed and updated as appropriate: allergies, current medications, past family history, past medical history, past social history, past surgical history and problem list. Problem list updated.  Objective:   Vitals:   11/08/23 1053  BP: 107/66  Pulse: 86  Weight: 168 lb (76.2 kg)    Fetal Status: Fetal Heart Rate (bpm): 150 Fundal Height: 32 cm Movement: Present     General:  Alert, oriented and cooperative. Patient is in no acute distress.  Skin: Skin is warm and dry. No rash noted.   Cardiovascular: Normal heart rate noted  Respiratory: Normal respiratory effort, no problems with respiration noted  Abdomen: Soft, gravid, appropriate for gestational age.  Pain/Pressure: Absent     Pelvic: Cervical exam deferred        Extremities: Normal range of motion.     Mental Status:  Normal mood and affect. Normal behavior. Normal judgment and thought content.   Assessment and Plan:  Pregnancy: G3P1011 at [redacted]w[redacted]d  1. [redacted] weeks gestation of pregnancy (Primary)   2. Supervision of other normal pregnancy, antepartum Continue routine prenatal care Fundal height   Preterm labor symptoms and general obstetric precautions including but not limited to vaginal bleeding, contractions, leaking of fluid and fetal movement were reviewed in detail with the patient.  Please refer to After Visit Summary for other counseling recommendations.   Return in about 2 weeks (around 11/22/2023) for ROB,  in person, 36 weeks swabs.   Mariel Aloe, MD Faculty Attending Center for [facility]

## 2023-11-22 ENCOUNTER — Encounter: Admitting: Obstetrics

## 2023-11-23 ENCOUNTER — Telehealth: Payer: Self-pay | Admitting: Family Medicine

## 2023-11-23 ENCOUNTER — Encounter: Payer: Self-pay | Admitting: Family Medicine

## 2023-11-23 DIAGNOSIS — A749 Chlamydial infection, unspecified: Secondary | ICD-10-CM | POA: Insufficient documentation

## 2023-11-23 DIAGNOSIS — O98811 Other maternal infectious and parasitic diseases complicating pregnancy, first trimester: Secondary | ICD-10-CM | POA: Insufficient documentation

## 2023-11-23 NOTE — Telephone Encounter (Signed)
 Patient called after hours line.   I was called by the RN Fanny Homme reports being treated for yeast 1 month ago.  She again had sx of itching. Wants to know if she can take another dose of fluconazole  I instructed Alice to reassure patient that another dose of fluconazole is appropriate and if symptoms persist she needs to be evaluated in clinic or an urgent care is also appropriate.   RN Loyde Rule will call patient  Abner Ables, MD, MPH, ABFM, [facility] Attending Physician Center for [facility]

## 2023-11-28 NOTE — Progress Notes (Unsigned)
   PRENATAL VISIT NOTE  Subjective:  Ashlee Johnson is a 28 y.o. G3P1011 at [redacted]w[redacted]d being seen today for ongoing prenatal care.  She is currently monitored for the following issues for this {Blank single:19197::"high-risk","low-risk"} pregnancy and has Supervision of other normal pregnancy, antepartum and Chlamydia infection affecting pregnancy in first trimester on their problem list.  Patient reports {sx:14538}.   .  .   . Denies leaking of fluid.   The following portions of the patient's history were reviewed and updated as appropriate: allergies, current medications, past family history, past medical history, past social history, past surgical history and problem list.   Objective:  There were no vitals filed for this visit.  Fetal Status:           General:  Alert, oriented and cooperative. Patient is in no acute distress.  Skin: Skin is warm and dry. No rash noted.   Cardiovascular: Normal heart rate noted  Respiratory: Normal respiratory effort, no problems with respiration noted  Abdomen: Soft, gravid, appropriate for gestational age.        Pelvic: Cervical exam deferred        Extremities: Normal range of motion.     Mental Status: Normal mood and affect. Normal behavior. Normal judgment and thought content.   Assessment and Plan:  Pregnancy: G3P1011 at [redacted]w[redacted]d  1. Supervision of other normal pregnancy, antepartum (Primary) Patient is doing well, feeling regular fetal movement BP, FHR, FH appropriate  2. [redacted] weeks gestation of pregnancy Anticipatory guidance about next visit/weeks of pregnancy given   Term labor symptoms and general obstetric precautions including but not limited to vaginal bleeding, contractions, leaking of fluid and fetal movement were reviewed in detail with the patient. Please refer to After Visit Summary for other counseling recommendations.   No follow-ups on file.  Future Appointments  Date Time Provider Department Center  11/29/2023  2:30 PM  Carmyn Hamm E, PA-C [facility] None  12/14/2023  1:00 PM Dellar Fenton, DO [facility] None    Luevenia Saha, PA-C

## 2023-11-29 ENCOUNTER — Other Ambulatory Visit (HOSPITAL_COMMUNITY)
Admission: RE | Admit: 2023-11-29 | Discharge: 2023-11-29 | Disposition: A | Discharge status: Home / Self Care | Source: Ambulatory Visit | Attending: Physician Assistant | Admitting: Physician Assistant

## 2023-11-29 ENCOUNTER — Ambulatory Visit: Admitting: Physician Assistant

## 2023-11-29 VITALS — BP 117/66 | HR 89 | Wt 171.0 lb

## 2023-11-29 DIAGNOSIS — Z3480 Encounter for supervision of other normal pregnancy, unspecified trimester: Secondary | ICD-10-CM

## 2023-11-29 DIAGNOSIS — Z3A37 37 weeks gestation of pregnancy: Secondary | ICD-10-CM | POA: Diagnosis not present

## 2023-12-01 ENCOUNTER — Encounter: Payer: Self-pay | Admitting: Physician Assistant

## 2023-12-01 LAB — CERVICOVAGINAL ANCILLARY ONLY
Bacterial Vaginitis (gardnerella): NEGATIVE
Candida Glabrata: NEGATIVE
Candida Vaginitis: NEGATIVE
Chlamydia: NEGATIVE
Comment: NEGATIVE
Comment: NEGATIVE
Comment: NEGATIVE
Comment: NEGATIVE
Comment: NEGATIVE
Comment: NORMAL
Neisseria Gonorrhea: NEGATIVE
Trichomonas: NEGATIVE

## 2023-12-01 LAB — STREP GP B NAA: Strep Gp B NAA: NEGATIVE

## 2023-12-05 NOTE — Progress Notes (Unsigned)
   PRENATAL VISIT NOTE  Subjective:  Ashlee Johnson is a 28 y.o. G3P1011 at [redacted]w[redacted]d being seen today for ongoing prenatal care.  She is currently monitored for the following issues for this {Blank single:19197::"high-risk","low-risk"} pregnancy and has Supervision of other normal pregnancy, antepartum and Chlamydia infection affecting pregnancy in first trimester on their problem list.  Patient reports {sx:14538}.   .  .   . Denies leaking of fluid.   The following portions of the patient's history were reviewed and updated as appropriate: allergies, current medications, past family history, past medical history, past social history, past surgical history and problem list.   Objective:  There were no vitals filed for this visit.  Fetal Status:           General:  Alert, oriented and cooperative. Patient is in no acute distress.  Skin: Skin is warm and dry. No rash noted.   Cardiovascular: Normal heart rate noted  Respiratory: Normal respiratory effort, no problems with respiration noted  Abdomen: Soft, gravid, appropriate for gestational age.        Pelvic: Cervical exam deferred        Extremities: Normal range of motion.     Mental Status: Normal mood and affect. Normal behavior. Normal judgment and thought content.   Assessment and Plan:  Pregnancy: G3P1011 at [redacted]w[redacted]d  1. Supervision of other normal pregnancy, antepartum (Primary) Patient doing well,  feeling regular fetal movement BP, FHR, FH appropriate  2. [redacted] weeks gestation of pregnancy Anticipatory guidance about next visits/weeks of pregnancy given.   Term labor symptoms and general obstetric precautions including but not limited to vaginal bleeding, contractions, leaking of fluid and fetal movement were reviewed in detail with the patient. Please refer to After Visit Summary for other counseling recommendations.   No follow-ups on file.  Future Appointments  Date Time Provider Department Center  12/06/2023  9:55 AM  Amilliana Hayworth E, PA-C [facility] None  12/14/2023  1:00 PM Dellar Fenton, DO [facility] None    Luevenia Saha, PA-C

## 2023-12-06 ENCOUNTER — Ambulatory Visit (INDEPENDENT_AMBULATORY_CARE_PROVIDER_SITE_OTHER): Admitting: Physician Assistant

## 2023-12-06 VITALS — BP 116/76 | HR 98 | Wt 172.3 lb

## 2023-12-06 DIAGNOSIS — Z3480 Encounter for supervision of other normal pregnancy, unspecified trimester: Secondary | ICD-10-CM | POA: Diagnosis not present

## 2023-12-06 DIAGNOSIS — Z3A38 38 weeks gestation of pregnancy: Secondary | ICD-10-CM

## 2023-12-06 NOTE — Progress Notes (Signed)
 Pt presents for ROB. Experiencing pain on pubic bone.

## 2023-12-09 ENCOUNTER — Inpatient Hospital Stay (HOSPITAL_COMMUNITY): Admitting: Anesthesiology

## 2023-12-09 ENCOUNTER — Other Ambulatory Visit: Payer: Self-pay

## 2023-12-09 ENCOUNTER — Encounter (HOSPITAL_COMMUNITY): Payer: Self-pay | Admitting: Obstetrics & Gynecology

## 2023-12-09 ENCOUNTER — Inpatient Hospital Stay (HOSPITAL_COMMUNITY)
Admission: AD | Admit: 2023-12-09 | Discharge: 2023-12-11 | DRG: 807 | Disposition: A | Discharge status: Home / Self Care | Attending: Obstetrics & Gynecology | Admitting: Obstetrics & Gynecology

## 2023-12-09 DIAGNOSIS — O98811 Other maternal infectious and parasitic diseases complicating pregnancy, first trimester: Secondary | ICD-10-CM | POA: Diagnosis present

## 2023-12-09 DIAGNOSIS — O326XX0 Maternal care for compound presentation, not applicable or unspecified: Secondary | ICD-10-CM | POA: Diagnosis not present

## 2023-12-09 DIAGNOSIS — O26893 Other specified pregnancy related conditions, third trimester: Secondary | ICD-10-CM | POA: Diagnosis not present

## 2023-12-09 DIAGNOSIS — O4202 Full-term premature rupture of membranes, onset of labor within 24 hours of rupture: Secondary | ICD-10-CM | POA: Diagnosis not present

## 2023-12-09 DIAGNOSIS — Z3A38 38 weeks gestation of pregnancy: Secondary | ICD-10-CM | POA: Diagnosis not present

## 2023-12-09 DIAGNOSIS — Z8249 Family history of ischemic heart disease and other diseases of the circulatory system: Secondary | ICD-10-CM | POA: Diagnosis not present

## 2023-12-09 DIAGNOSIS — A749 Chlamydial infection, unspecified: Secondary | ICD-10-CM | POA: Diagnosis present

## 2023-12-09 DIAGNOSIS — Z3480 Encounter for supervision of other normal pregnancy, unspecified trimester: Secondary | ICD-10-CM

## 2023-12-09 LAB — TYPE AND SCREEN
ABO/RH(D): A POS
Antibody Screen: NEGATIVE

## 2023-12-09 LAB — CBC
HCT: 33.3 % — ABNORMAL LOW (ref 36.0–46.0)
Hemoglobin: 10.7 g/dL — ABNORMAL LOW (ref 12.0–15.0)
MCH: 29.2 pg (ref 26.0–34.0)
MCHC: 32.1 g/dL (ref 30.0–36.0)
MCV: 90.7 fL (ref 80.0–100.0)
Platelets: 231 10*3/uL (ref 150–400)
RBC: 3.67 MIL/uL — ABNORMAL LOW (ref 3.87–5.11)
RDW: 15.1 % (ref 11.5–15.5)
WBC: 13.1 10*3/uL — ABNORMAL HIGH (ref 4.0–10.5)
nRBC: 0 % (ref 0.0–0.2)

## 2023-12-09 MED ORDER — LIDOCAINE HCL (PF) 1 % IJ SOLN: 30.0000 mL | INTRAMUSCULAR | Status: DC | PRN

## 2023-12-09 MED ORDER — PHENYLEPHRINE 80 MCG/ML (10ML) SYRINGE FOR IV PUSH (FOR BLOOD PRESSURE SUPPORT): 80.0000 ug | PREFILLED_SYRINGE | INTRAVENOUS | Status: DC | PRN

## 2023-12-09 MED ORDER — ACETAMINOPHEN 325 MG PO TABS
650.0000 mg | ORAL_TABLET | ORAL | Status: DC | PRN
  Administered 2023-12-09: 650 mg via ORAL
  Filled 2023-12-09: qty 2

## 2023-12-09 MED ORDER — LACTATED RINGERS IV SOLN: 500.0000 mL | Freq: Once | INTRAVENOUS | Status: DC

## 2023-12-09 MED ORDER — LACTATED RINGERS IV SOLN
INTRAVENOUS | Status: DC
  Administered 2023-12-09: 125 mL via INTRAVENOUS

## 2023-12-09 MED ORDER — LACTATED RINGERS IV SOLN: 500.0000 mL | INTRAVENOUS | Status: DC | PRN

## 2023-12-09 MED ORDER — EPHEDRINE 5 MG/ML INJ: 10.0000 mg | INTRAVENOUS | Status: DC | PRN

## 2023-12-09 MED ORDER — DIPHENHYDRAMINE HCL 50 MG/ML IJ SOLN
12.5000 mg | INTRAMUSCULAR | Status: AC | PRN
  Administered 2023-12-09 (×3): 12.5 mg via INTRAVENOUS
  Filled 2023-12-09: qty 1

## 2023-12-09 MED ORDER — OXYTOCIN-SODIUM CHLORIDE 30-0.9 UT/500ML-% IV SOLN
2.5000 [IU]/h | INTRAVENOUS | Status: DC
  Administered 2023-12-10: 2.5 [IU]/h via INTRAVENOUS
  Filled 2023-12-09: qty 500

## 2023-12-09 MED ORDER — SOD CITRATE-CITRIC ACID 500-334 MG/5ML PO SOLN: 30.0000 mL | ORAL | Status: DC | PRN

## 2023-12-09 MED ORDER — FENTANYL CITRATE (PF) 100 MCG/2ML IJ SOLN
100.0000 ug | INTRAMUSCULAR | Status: DC | PRN
  Administered 2023-12-09: 100 ug via INTRAVENOUS
  Filled 2023-12-09: qty 2

## 2023-12-09 MED ORDER — ONDANSETRON HCL 4 MG/2ML IJ SOLN
4.0000 mg | Freq: Four times a day (QID) | INTRAMUSCULAR | Status: DC | PRN
  Administered 2023-12-09 – 2023-12-10 (×2): 4 mg via INTRAVENOUS
  Filled 2023-12-09 (×2): qty 2

## 2023-12-09 MED ORDER — FENTANYL-BUPIVACAINE-NACL 0.5-0.125-0.9 MG/250ML-% EP SOLN
12.0000 mL/h | EPIDURAL | Status: DC | PRN
  Administered 2023-12-09: 12 mL/h via EPIDURAL
  Filled 2023-12-09: qty 250

## 2023-12-09 MED ORDER — OXYTOCIN BOLUS FROM INFUSION
333.0000 mL | Freq: Once | INTRAVENOUS | Status: AC
  Administered 2023-12-09: 333 mL via INTRAVENOUS

## 2023-12-09 MED ORDER — LIDOCAINE HCL (PF) 1 % IJ SOLN
INTRAMUSCULAR | Status: DC | PRN
  Administered 2023-12-09 (×2): 4 mL via EPIDURAL

## 2023-12-09 MED ORDER — OXYCODONE-ACETAMINOPHEN 5-325 MG PO TABS: 2.0000 | ORAL_TABLET | ORAL | Status: DC | PRN

## 2023-12-09 MED ORDER — OXYCODONE-ACETAMINOPHEN 5-325 MG PO TABS: 1.0000 | ORAL_TABLET | ORAL | Status: DC | PRN

## 2023-12-09 NOTE — MAU Note (Signed)
 Ashlee Johnson is a 28 y.o. at [redacted]w[redacted]d here in MAU reporting: (eating in lobby when called)contractions started at 0930, are every 3 min, lasting almost a minute.  Feels like the back labor she had with her first child.  No bleeding or leaking. Baby is moving.  Was 1cm when last checked.  Onset of complaint: 0930 Pain score: severe Vitals:   12/09/23 1250  BP: 118/66  Pulse: 96  Resp: 17  Temp: 98 F (36.7 C)  SpO2: 99%     FHT:136 Lab orders placed from triage:

## 2023-12-09 NOTE — Progress Notes (Signed)
 LABOR PROGRESS NOTE  Patient Name: Ashlee Johnson, female   DOB: May 20, 1996, 28 y.o.  MRN: [id number]  Patient now feeling comfortable after epidural. Discussed AROM vs continued expectant management. Patient prefers expectant management at this time. Cat I. Anticipate NSVD.  Maud Sorenson, MD

## 2023-12-09 NOTE — Anesthesia Preprocedure Evaluation (Signed)
Anesthesia Evaluation  Patient identified by MRN, date of birth, ID band Patient awake    Reviewed: Allergy & Precautions, Patient's Chart, lab work & pertinent test results  History of Anesthesia Complications Negative for: history of anesthetic complications  Airway Mallampati: II  TM Distance: >3 FB Neck ROM: Full    Dental no notable dental hx.    Pulmonary neg pulmonary ROS   Pulmonary exam normal        Cardiovascular negative cardio ROS Normal cardiovascular exam     Neuro/Psych negative neurological ROS     GI/Hepatic negative GI ROS, Neg liver ROS,,,  Endo/Other  negative endocrine ROS    Renal/GU negative Renal ROS  negative genitourinary   Musculoskeletal negative musculoskeletal ROS (+)    Abdominal   Peds  Hematology negative hematology ROS (+)   Anesthesia Other Findings Day of surgery medications reviewed with patient.  Reproductive/Obstetrics (+) Pregnancy                              Anesthesia Physical Anesthesia Plan  ASA: 2  Anesthesia Plan: Epidural   Post-op Pain Management:    Induction:   PONV Risk Score and Plan: Treatment may vary due to age or medical condition  Airway Management Planned: Natural Airway  Additional Equipment: Fetal Monitoring  Intra-op Plan:   Post-operative Plan:   Informed Consent: I have reviewed the patients History and Physical, chart, labs and discussed the procedure including the risks, benefits and alternatives for the proposed anesthesia with the patient or authorized representative who has indicated his/her understanding and acceptance.       Plan Discussed with:   Anesthesia Plan Comments:          Anesthesia Quick Evaluation

## 2023-12-09 NOTE — H&P (Signed)
 Ashlee Johnson is a 28 y.o. female G3P1011 at [redacted]w[redacted]d  presenting for active labor at term.  Positive chlamydia in the first trimester with negative TOC. Otherwise, pregnancy has been uncomplicated.  US  MFM OB FU on 10/30/23 at [redacted]w[redacted]d with FHR 133, cephalic position, posterior placenta, AFI 12  EFW 1964gm, 21%  NURSING  PROVIDER  Office Location [facility] Dating by LMP c/w U/S at 10 wks  [facility] Model Traditional Anatomy U/S  normal  Initiated care at  [facility] [facility]  English              LAB RESULTS   Support Person Ashlee Johnson (FOB) Genetics NIPS: Low-Risk Female AFP: neg    NT/IT (FT only)     Carrier Screen Horizon: Negative x4  Rhogam  A/Positive/-- (10/25 1110) A1C/GTT Early:  Third trimester: 65, 122, 80  Flu Vaccine     TDaP Vaccine 10/11/23 Blood Type A/Positive/-- (10/25 1110)  Covid Vaccine Yes Antibody Negative (10/25 1110)  RSV Vaccine  Rubella 1.76 (10/25 1110) IMMUNE  Feeding Plan bottle RPR Non Reactive (10/25 1110)  Contraception Undecided/ maybe Phexii HBsAg Negative (10/25 1110)  Circumcision N/a HIV Non Reactive (10/25 1110)  Pediatrician  [facility] HCVAb Non Reactive (10/25 1110)  Prenatal Classes       Pap Diagnosis  Date Value Ref Range Status  06/02/2023 - Benign reactive/reparative changes  Final    BTLConsent  GC/CT Initial:  Neg/POS 36wks:  neg/neg  VBAC  Consent  GBS  Negative For PCN allergy, check sensitivities        DME Rx [X]  BP cuff [ ]  Weight Scale Waterbirth  [ ]  Class [ ]  Consent [ ]  CNM visit  PHQ9 & GAD7 [X]  new OB [  ] 28 weeks  [  ] 36 weeks Induction  [ ]  Orders Entered [ ] Foley Y/N    OB History     Gravida  3   Para  1   Term  1   Preterm      AB  1   Living  1      SAB  0   IAB  1   Ectopic  0   Multiple  0   Live Births  1          Past Medical History:  Diagnosis Date   Heart murmur    Past Surgical History:  Procedure Laterality Date   NO PAST SURGERIES     Family History: family history includes  Cancer in her father; Heart disease in her father. Social History:  reports that she has never smoked. She has never used smokeless tobacco. She reports that she does not currently use alcohol. She reports that she does not currently use drugs after having used the following drugs: Marijuana.     Maternal Diabetes: No Genetic Screening: Normal Maternal Ultrasounds/Referrals: Normal Fetal Ultrasounds or other Referrals:  None Maternal Substance Abuse:  No Significant Maternal Medications:  None Significant Maternal Lab Results:  Group B Strep negative Number of Prenatal Visits:greater than 3 verified prenatal visits Maternal Vaccinations:TDap and Covid Other Comments:   none  Review of Systems  Constitutional:  Negative for chills, fatigue and fever.  Eyes:  Negative for visual disturbance.  Respiratory:  Negative for shortness of breath.   Cardiovascular:  Negative for chest pain.  Gastrointestinal:  Positive for abdominal pain. Negative for vomiting.  Genitourinary:  Negative for difficulty  urinating, dysuria, flank pain, pelvic pain, vaginal bleeding, vaginal discharge and vaginal pain.  Neurological:  Negative for dizziness and headaches.  Psychiatric/Behavioral: Negative.     Maternal Medical History:  Reason for admission: Contractions.   Contractions: Onset was 3-5 hours ago.   Frequency: regular.   Perceived severity is moderate.   Fetal activity: Perceived fetal activity is normal.   Prenatal Complications - Diabetes: none.   Dilation: 4.5 Effacement (%): 90 Station: -2 Exam by:: K.Wilson,RN Blood pressure 118/66, pulse 96, temperature 98 F (36.7 C), temperature source Oral, resp. rate 17, height 5\' 4"  (1.626 m), weight 78.6 kg, last menstrual period 03/05/2023, SpO2 99%. Maternal Exam:  Uterine Assessment: Contraction strength is mild.  Contraction frequency is regular.  Abdomen: Fetal presentation: vertex Pelvis: adequate for delivery.   Cervix: Cervix  evaluated by digital exam.     Fetal Exam Fetal Monitor Review: Mode: ultrasound.   Baseline rate: 135.  Variability: moderate (6-25 bpm).   Pattern: accelerations present and no decelerations.   Fetal State Assessment: Category I - tracings are normal.   Physical Exam Vitals and nursing note reviewed.  Constitutional:      Appearance: She is well-developed.  Cardiovascular:     Rate and Rhythm: Normal rate.     Heart sounds: Normal heart sounds.  Pulmonary:     Effort: Pulmonary effort is normal.     Breath sounds: Normal breath sounds.  Abdominal:     Palpations: Abdomen is soft.  Musculoskeletal:        General: Normal range of motion.     Cervical back: Normal range of motion.  Skin:    General: Skin is warm and dry.  Neurological:     Mental Status: She is alert and oriented to person, place, and time.  Psychiatric:        Behavior: Behavior normal.        Thought Content: Thought content normal.        Judgment: Judgment normal.     Prenatal labs: ABO, Rh: A/Positive/-- (10/25 1110) Antibody: Negative (10/25 1110) Rubella: 1.76 (10/25 1110) RPR: Non Reactive (02/19 0926)  HBsAg: Negative (10/25 1110)  HIV: Non Reactive (02/19 0926)  GBS: Negative/-- (04/23 1509)   Assessment/Plan: [id number]  at [redacted]w[redacted]d  admitted for active labor at term GBS neg  Admit to L&D  IV pain medications per pt preference May have epidural when desired Plans to bottle feed Maybe Ashlee Johnson, but undecided about contraception  Ashlee Johnson 12/09/2023, 4:09 PM

## 2023-12-09 NOTE — Discharge Summary (Signed)
 Postpartum Discharge Summary  Date of Service updated***     Patient Name: Ashlee Johnson DOB: 05/24/1996 MRN: [id number]  Date of admission: 12/09/2023 Delivery date:12/09/2023 Delivering provider: Maud Sorenson Date of discharge: 12/09/2023  Admitting diagnosis: Normal labor [O80, Z37.9] Intrauterine pregnancy: [redacted]w[redacted]d     Secondary diagnosis:  Principal Problem:   NSVD (normal spontaneous vaginal delivery) Active Problems:   Supervision of other normal pregnancy, antepartum   Chlamydia infection affecting pregnancy in first trimester  Additional problems: ***    Discharge diagnosis: Term Pregnancy Delivered                                              Post partum procedures:{Postpartum procedures:23558} Augmentation: N/A Complications: None  Hospital course: Onset of Labor With Vaginal Delivery      28 y.o. yo G3P1011 at [redacted]w[redacted]d was admitted in Latent Labor on 12/09/2023. Labor course was complicated by none.  Membrane Rupture Time/Date: 8:33 PM,12/09/2023  Delivery Method:Vaginal, Spontaneous Operative Delivery:N/A Episiotomy: None Lacerations:  1st degree Patient had a postpartum course complicated by ***.  She is ambulating, tolerating a regular diet, passing flatus, and urinating well. Patient is discharged home in stable condition on 12/09/23.  Newborn Data: Birth date:12/09/2023 Birth time:11:14 PM Gender:Female Living status:Living Apgars:9 ,9  Weight:   Magnesium Sulfate received: No BMZ received: No Rhophylac:No MMR:No T-DaP:Given prenatally Flu: No RSV Vaccine received: No Transfusion:{Transfusion received:30440034}  Immunizations received: Immunization History  Administered Date(s) Administered   Tdap 10/11/2023    Physical exam  Vitals:   12/09/23 2152 12/09/23 2200 12/09/23 2230 12/09/23 2309  BP: (!) 100/51 (!) 100/48 (!) 99/54 122/76  Pulse: 73 78 82 (!) 118  Resp:      Temp:      TempSrc:      SpO2:  100% 100% 100%  Weight:       Height:       General: {Exam; general:21111117} Lochia: {Desc; appropriate/inappropriate:30686::"appropriate"} Uterine Fundus: {Desc; firm/soft:30687} Incision: {Exam; incision:21111123} DVT Evaluation: {Exam; dvt:2111122} Labs: Lab Results  Component Value Date   WBC 13.1 (H) 12/09/2023   HGB 10.7 (L) 12/09/2023   HCT 33.3 (L) 12/09/2023   MCV 90.7 12/09/2023   PLT 231 12/09/2023      Latest Ref Rng & Units 02/05/2023   11:07 PM  CMP  Glucose 70 - 99 mg/dL 96   BUN 6 - 20 mg/dL 8   Creatinine 5.36 - 6.44 mg/dL 0.34   Sodium 742 - 595 mmol/L 136   Potassium 3.5 - 5.1 mmol/L 3.4   Chloride 98 - 111 mmol/L 106   CO2 22 - 32 mmol/L 24   Calcium 8.9 - 10.3 mg/dL 9.0    Edinburgh Score:    07/18/2019   10:36 AM  Edinburgh Postnatal Depression Scale Screening Tool  I have been able to laugh and see the funny side of things. 0  I have looked forward with enjoyment to things. 0  I have blamed myself unnecessarily when things went wrong. 1  I have been anxious or worried for no good reason. 0  I have felt scared or panicky for no good reason. 0  Things have been getting on top of me. 0  I have been so unhappy that I have had difficulty sleeping. 0  I have felt sad or miserable. 0  I have been so unhappy  that I have been crying. 1  The thought of harming myself has occurred to me. 0  Edinburgh Postnatal Depression Scale Total 2   No data recorded  After visit meds:  Allergies as of 12/09/2023   No Known Allergies   Med Rec must be completed prior to using this [facility]***        Discharge home in stable condition Infant Feeding: {Baby feeding:23562} Infant Disposition:{CHL IP OB HOME WITH WUJWJX:91478} Discharge instruction: per After Visit Summary and Postpartum booklet. Activity: Advance as tolerated. Pelvic rest for 6 weeks.  Diet: {OB GNFA:21308657} Future Appointments: Future Appointments  Date Time Provider Department Center  12/14/2023  1:00 PM Dellar Fenton, DO [facility] None   Follow up Visit:  Message sent to [facility] 5/3  Please schedule this patient for a In person postpartum visit in 6 weeks with the following provider: Any provider. Additional Postpartum F/U: None   Low risk pregnancy complicated by:  none Delivery mode:  Vaginal, Spontaneous Anticipated Birth Control:   Phexii?   12/09/2023 Maud Sorenson, MD

## 2023-12-10 ENCOUNTER — Encounter (HOSPITAL_COMMUNITY): Payer: Self-pay | Admitting: Obstetrics & Gynecology

## 2023-12-10 LAB — RPR: RPR Ser Ql: NONREACTIVE

## 2023-12-10 MED ORDER — ZOLPIDEM TARTRATE 5 MG PO TABS: 5.0000 mg | ORAL_TABLET | Freq: Every evening | ORAL | Status: DC | PRN

## 2023-12-10 MED ORDER — WITCH HAZEL-GLYCERIN EX PADS: 1.0000 | MEDICATED_PAD | CUTANEOUS | Status: DC | PRN

## 2023-12-10 MED ORDER — MEDROXYPROGESTERONE ACETATE 150 MG/ML IM SUSP: 150.0000 mg | INTRAMUSCULAR | Status: DC | PRN

## 2023-12-10 MED ORDER — ONDANSETRON HCL 4 MG/2ML IJ SOLN: 4.0000 mg | INTRAMUSCULAR | Status: DC | PRN

## 2023-12-10 MED ORDER — OXYCODONE HCL 5 MG PO TABS
10.0000 mg | ORAL_TABLET | Freq: Four times a day (QID) | ORAL | Status: DC | PRN
  Administered 2023-12-10: 10 mg via ORAL
  Filled 2023-12-10: qty 2

## 2023-12-10 MED ORDER — OXYCODONE HCL 5 MG PO TABS
5.0000 mg | ORAL_TABLET | Freq: Four times a day (QID) | ORAL | Status: DC | PRN
  Administered 2023-12-10 – 2023-12-11 (×2): 5 mg via ORAL
  Filled 2023-12-10 (×2): qty 1

## 2023-12-10 MED ORDER — IBUPROFEN 800 MG PO TABS
800.0000 mg | ORAL_TABLET | Freq: Three times a day (TID) | ORAL | Status: DC
  Administered 2023-12-10 – 2023-12-11 (×4): 800 mg via ORAL
  Filled 2023-12-10 (×4): qty 1

## 2023-12-10 MED ORDER — PRENATAL MULTIVITAMIN CH
1.0000 | ORAL_TABLET | Freq: Every day | ORAL | Status: DC
  Administered 2023-12-10: 1 via ORAL
  Filled 2023-12-10: qty 1

## 2023-12-10 MED ORDER — BENZOCAINE-MENTHOL 20-0.5 % EX AERO
1.0000 | INHALATION_SPRAY | CUTANEOUS | Status: DC | PRN
  Administered 2023-12-10: 1 via TOPICAL
  Filled 2023-12-10: qty 56

## 2023-12-10 MED ORDER — SIMETHICONE 80 MG PO CHEW: 80.0000 mg | CHEWABLE_TABLET | ORAL | Status: DC | PRN

## 2023-12-10 MED ORDER — ONDANSETRON HCL 4 MG PO TABS: 4.0000 mg | ORAL_TABLET | ORAL | Status: DC | PRN

## 2023-12-10 MED ORDER — AMMONIA AROMATIC IN INHA
RESPIRATORY_TRACT | Status: AC
Start: 2023-12-10 — End: 2023-12-10
  Filled 2023-12-10: qty 10

## 2023-12-10 MED ORDER — DIBUCAINE (PERIANAL) 1 % EX OINT: 1.0000 | TOPICAL_OINTMENT | CUTANEOUS | Status: DC | PRN

## 2023-12-10 MED ORDER — COCONUT OIL OIL: 1.0000 | TOPICAL_OIL | Status: DC | PRN

## 2023-12-10 MED ORDER — DIPHENHYDRAMINE HCL 25 MG PO CAPS: 25.0000 mg | ORAL_CAPSULE | Freq: Four times a day (QID) | ORAL | Status: DC | PRN

## 2023-12-10 MED ORDER — SENNOSIDES-DOCUSATE SODIUM 8.6-50 MG PO TABS
2.0000 | ORAL_TABLET | Freq: Every day | ORAL | Status: DC
  Administered 2023-12-10 – 2023-12-11 (×2): 2 via ORAL
  Filled 2023-12-10 (×2): qty 2

## 2023-12-10 MED ORDER — ACETAMINOPHEN 500 MG PO TABS
1000.0000 mg | ORAL_TABLET | Freq: Three times a day (TID) | ORAL | Status: DC
  Administered 2023-12-10 – 2023-12-11 (×4): 1000 mg via ORAL
  Filled 2023-12-10 (×4): qty 2

## 2023-12-10 NOTE — Anesthesia Postprocedure Evaluation (Signed)
 Anesthesia Post Note  Patient: Ashlee Johnson  Procedure(s) Performed: AN AD HOC LABOR EPIDURAL     Patient location during evaluation: Mother Baby Anesthesia Type: Epidural Level of consciousness: awake, awake and alert and oriented Pain management: satisfactory to patient Vital Signs Assessment: post-procedure vital signs reviewed and stable Respiratory status: spontaneous breathing, nonlabored ventilation and respiratory function stable Cardiovascular status: stable and blood pressure returned to baseline Postop Assessment: no headache, no backache, no apparent nausea or vomiting, able to ambulate, adequate PO intake and patient able to bend at knees Anesthetic complications: no   No notable events documented.  Last Vitals:  Vitals:   12/10/23 0244 12/10/23 0640  BP: (!) 114/58 (!) 105/53  Pulse: 80 75  Resp: 18 18  Temp: 36.7 C 36.9 C  SpO2: 100% 99%    Last Pain:  Vitals:   12/10/23 0757  TempSrc:   PainSc: 8    Pain Goal:                   Ashlee Johnson

## 2023-12-10 NOTE — Progress Notes (Signed)
 Post Partum Day 1 Subjective: no complaints, up ad lib, voiding, and tolerating PO  Objective: Blood pressure 111/66, pulse 67, temperature 98.6 F (37 C), temperature source Oral, resp. rate 16, height 5\' 4"  (1.626 m), weight 78.6 kg, last menstrual period 03/05/2023, SpO2 100%, unknown if currently breastfeeding.  Physical Exam:  General: alert, cooperative, and no distress Lochia: appropriate Uterine Fundus: firm DVT Evaluation: No evidence of DVT seen on physical exam.  Recent Labs    12/09/23 1626  HGB 10.7*  HCT 33.3*    Assessment/Plan: Plan for discharge tomorrow   LOS: 1 day   Onnie Bilis, MD 12/10/2023, 2:51 PM

## 2023-12-10 NOTE — Lactation Note (Signed)
 This note was copied from a baby's chart. Lactation Consultation Note  Patient Name: Girl Dodi Wragge [id number] Date: 12/10/2023 Age:27 hours  Mom chooses to formula feed.   Maternal Data    Feeding Nipple Type: Slow - flow  LATCH Score                    Lactation Tools Discussed/Used    Interventions    Discharge    Consult Status Consult Status: Complete    Wolf Boulay G 12/10/2023, 3:47 AM

## 2023-12-11 ENCOUNTER — Other Ambulatory Visit (HOSPITAL_COMMUNITY): Payer: Self-pay

## 2023-12-11 MED ORDER — PSEUDOEPHEDRINE HCL 30 MG PO TABS: 30.0000 mg | ORAL_TABLET | ORAL | 0 refills | Status: DC | PRN

## 2023-12-11 MED ORDER — WITCH HAZEL-GLYCERIN EX PADS
1.0000 | MEDICATED_PAD | CUTANEOUS | 12 refills | Status: DC | PRN
  Filled 2023-12-11: qty 40, 40d supply, fill #0

## 2023-12-11 MED ORDER — DIBUCAINE (PERIANAL) 1 % EX OINT
1.0000 | TOPICAL_OINTMENT | CUTANEOUS | 0 refills | Status: DC | PRN
  Filled 2023-12-11: qty 28, fill #0

## 2023-12-11 MED ORDER — BENZOCAINE-MENTHOL 20-0.5 % EX AERO
1.0000 | INHALATION_SPRAY | CUTANEOUS | 0 refills | Status: DC | PRN
  Filled 2023-12-11: qty 56, fill #0

## 2023-12-11 MED ORDER — ACETAMINOPHEN 500 MG PO TABS
1000.0000 mg | ORAL_TABLET | Freq: Three times a day (TID) | ORAL | 0 refills | Status: DC
  Filled 2023-12-11: qty 30, 5d supply, fill #0

## 2023-12-11 MED ORDER — IBUPROFEN 800 MG PO TABS
800.0000 mg | ORAL_TABLET | Freq: Three times a day (TID) | ORAL | 0 refills | Status: DC
  Filled 2023-12-11: qty 30, 10d supply, fill #0

## 2023-12-11 NOTE — Discharge Instructions (Signed)

## 2023-12-11 NOTE — Plan of Care (Signed)
 Problem: Education: Goal: Knowledge of General Education information will improve Description: Including pain rating scale, medication(s)/side effects and non-pharmacologic comfort measures 12/11/2023 1202 by Osvaldo Blender, LPN Outcome: Adequate for Discharge 12/11/2023 0824 by Osvaldo Blender, LPN Outcome: Progressing   Problem: Health Behavior/Discharge Planning: Goal: Ability to manage health-related needs will improve 12/11/2023 1202 by Osvaldo Blender, LPN Outcome: Adequate for Discharge 12/11/2023 0824 by Osvaldo Blender, LPN Outcome: Progressing   Problem: Clinical Measurements: Goal: Ability to maintain clinical measurements within normal limits will improve 12/11/2023 1202 by Osvaldo Blender, LPN Outcome: Adequate for Discharge 12/11/2023 0824 by Osvaldo Blender, LPN Outcome: Progressing Goal: Will remain free from infection 12/11/2023 1202 by Osvaldo Blender, LPN Outcome: Adequate for Discharge 12/11/2023 0824 by Osvaldo Blender, LPN Outcome: Progressing Goal: Diagnostic test results will improve 12/11/2023 1202 by Osvaldo Blender, LPN Outcome: Adequate for Discharge 12/11/2023 0824 by Osvaldo Blender, LPN Outcome: Progressing Goal: Respiratory complications will improve 12/11/2023 1202 by Osvaldo Blender, LPN Outcome: Adequate for Discharge 12/11/2023 0824 by Osvaldo Blender, LPN Outcome: Progressing Goal: Cardiovascular complication will be avoided 12/11/2023 1202 by Osvaldo Blender, LPN Outcome: Adequate for Discharge 12/11/2023 0824 by Osvaldo Blender, LPN Outcome: Progressing   Problem: Activity: Goal: Risk for activity intolerance will decrease 12/11/2023 1202 by Osvaldo Blender, LPN Outcome: Adequate for Discharge 12/11/2023 0824 by Osvaldo Blender, LPN Outcome: Progressing   Problem: Nutrition: Goal: Adequate nutrition will be maintained 12/11/2023 1202 by Osvaldo Blender, LPN Outcome: Adequate for Discharge 12/11/2023 0824 by Osvaldo Blender, LPN Outcome: Progressing   Problem:  Coping: Goal: Level of anxiety will decrease 12/11/2023 1202 by Osvaldo Blender, LPN Outcome: Adequate for Discharge 12/11/2023 0824 by Osvaldo Blender, LPN Outcome: Progressing   Problem: Elimination: Goal: Will not experience complications related to bowel motility 12/11/2023 1202 by Osvaldo Blender, LPN Outcome: Adequate for Discharge 12/11/2023 0824 by Osvaldo Blender, LPN Outcome: Progressing Goal: Will not experience complications related to urinary retention 12/11/2023 1202 by Osvaldo Blender, LPN Outcome: Adequate for Discharge 12/11/2023 0824 by Osvaldo Blender, LPN Outcome: Progressing   Problem: Pain Managment: Goal: General experience of comfort will improve and/or be controlled 12/11/2023 1202 by Osvaldo Blender, LPN Outcome: Adequate for Discharge 12/11/2023 0824 by Osvaldo Blender, LPN Outcome: Progressing   Problem: Safety: Goal: Ability to remain free from injury will improve 12/11/2023 1202 by Osvaldo Blender, LPN Outcome: Adequate for Discharge 12/11/2023 0824 by Osvaldo Blender, LPN Outcome: Progressing   Problem: Skin Integrity: Goal: Risk for impaired skin integrity will decrease 12/11/2023 1202 by Osvaldo Blender, LPN Outcome: Adequate for Discharge 12/11/2023 0824 by Osvaldo Blender, LPN Outcome: Progressing   Problem: Education: Goal: Knowledge of Childbirth will improve 12/11/2023 1202 by Osvaldo Blender, LPN Outcome: Adequate for Discharge 12/11/2023 1610 by Osvaldo Blender, LPN Outcome: Progressing Goal: Ability to make informed decisions regarding treatment and plan of care will improve 12/11/2023 1202 by Osvaldo Blender, LPN Outcome: Adequate for Discharge 12/11/2023 9604 by Osvaldo Blender, LPN Outcome: Progressing Goal: Ability to state and carry out methods to decrease the pain will improve 12/11/2023 1202 by Osvaldo Blender, LPN Outcome: Adequate for Discharge 12/11/2023 5409 by Osvaldo Blender, LPN Outcome: Progressing Goal: Individualized Educational Video(s) 12/11/2023 1202 by  Osvaldo Blender, LPN Outcome: Adequate for Discharge 12/11/2023 0824 by Osvaldo Blender, LPN Outcome: Progressing   Problem: Coping: Goal: Ability to verbalize  concerns and feelings about labor and delivery will improve 12/11/2023 1202 by Osvaldo Blender, LPN Outcome: Adequate for Discharge 12/11/2023 5284 by Osvaldo Blender, LPN Outcome: Progressing   Problem: Life Cycle: Goal: Ability to make normal progression through stages of labor will improve 12/11/2023 1202 by Osvaldo Blender, LPN Outcome: Adequate for Discharge 12/11/2023 0824 by Osvaldo Blender, LPN Outcome: Progressing Goal: Ability to effectively push during vaginal delivery will improve 12/11/2023 1202 by Osvaldo Blender, LPN Outcome: Adequate for Discharge 12/11/2023 0824 by Osvaldo Blender, LPN Outcome: Progressing   Problem: Role Relationship: Goal: Will demonstrate positive interactions with the child 12/11/2023 1202 by Osvaldo Blender, LPN Outcome: Adequate for Discharge 12/11/2023 0824 by Osvaldo Blender, LPN Outcome: Progressing   Problem: Safety: Goal: Risk of complications during labor and delivery will decrease 12/11/2023 1202 by Osvaldo Blender, LPN Outcome: Adequate for Discharge 12/11/2023 0824 by Osvaldo Blender, LPN Outcome: Progressing   Problem: Pain Management: Goal: Relief or control of pain from uterine contractions will improve 12/11/2023 1202 by Osvaldo Blender, LPN Outcome: Adequate for Discharge 12/11/2023 0824 by Osvaldo Blender, LPN Outcome: Progressing   Problem: Education: Goal: Knowledge of condition will improve 12/11/2023 1202 by Osvaldo Blender, LPN Outcome: Adequate for Discharge 12/11/2023 0824 by Osvaldo Blender, LPN Outcome: Progressing Goal: Individualized Educational Video(s) 12/11/2023 1202 by Osvaldo Blender, LPN Outcome: Adequate for Discharge 12/11/2023 0824 by Osvaldo Blender, LPN Outcome: Progressing Goal: Individualized Newborn Educational Video(s) 12/11/2023 1202 by Osvaldo Blender, LPN Outcome: Adequate  for Discharge 12/11/2023 0824 by Osvaldo Blender, LPN Outcome: Progressing   Problem: Activity: Goal: Will verbalize the importance of balancing activity with adequate rest periods 12/11/2023 1202 by Osvaldo Blender, LPN Outcome: Adequate for Discharge 12/11/2023 0824 by Osvaldo Blender, LPN Outcome: Progressing Goal: Ability to tolerate increased activity will improve 12/11/2023 1202 by Osvaldo Blender, LPN Outcome: Adequate for Discharge 12/11/2023 0824 by Osvaldo Blender, LPN Outcome: Progressing   Problem: Coping: Goal: Ability to identify and utilize available resources and services will improve 12/11/2023 1202 by Osvaldo Blender, LPN Outcome: Adequate for Discharge 12/11/2023 0824 by Osvaldo Blender, LPN Outcome: Progressing   Problem: Life Cycle: Goal: Chance of risk for complications during the postpartum period will decrease 12/11/2023 1202 by Osvaldo Blender, LPN Outcome: Adequate for Discharge 12/11/2023 0824 by Osvaldo Blender, LPN Outcome: Progressing   Problem: Role Relationship: Goal: Ability to demonstrate positive interaction with newborn will improve 12/11/2023 1202 by Osvaldo Blender, LPN Outcome: Adequate for Discharge 12/11/2023 0824 by Osvaldo Blender, LPN Outcome: Progressing   Problem: Skin Integrity: Goal: Demonstration of wound healing without infection will improve 12/11/2023 1202 by Osvaldo Blender, LPN Outcome: Adequate for Discharge 12/11/2023 0824 by Osvaldo Blender, LPN Outcome: Progressing

## 2023-12-11 NOTE — Plan of Care (Signed)

## 2023-12-14 ENCOUNTER — Ambulatory Visit: Payer: Medicaid Other | Admitting: Dermatology

## 2023-12-26 ENCOUNTER — Telehealth (HOSPITAL_COMMUNITY): Payer: Self-pay | Admitting: *Deleted

## 2023-12-26 NOTE — Telephone Encounter (Signed)
 12/26/2023  Name: Jatasia Gundrum MRN: [id number] DOB: 05-06-1996  Reason for Call:  Transition of Care Hospital Discharge Call  Contact Status: Patient Contact Status: Message  Language assistant needed:          Follow-Up Questions:    Dimple Francis Postnatal Depression Scale:  In the Past 7 Days:    PHQ2-9 Depression Scale:     Discharge Follow-up:    Post-discharge interventions: NA  Pearlie Bougie, RN 12/26/2023 10:22

## 2024-01-02 NOTE — Progress Notes (Signed)
 [facility] 01/02/24  Dental procedures in this visit  . I5658 - PERIODONTAL SCALING AND ROOT PLANING - FOUR OR MORE TEETH PER QUADRANT UR (Completed)    Service provider: Mitchel Rocks, [facility], [place]    Billing provider: Ronelle Richters, DDS  . I5658 - PERIODONTAL SCALING AND ROOT PLANING - FOUR OR MORE TEETH PER QUADRANT LR (Completed)    Service provider: Mitchel Rocks, [facility]    Billing provider: Ronelle Richters, DDS   HEALTH HISTORY ? Vitals:  BP Readings from Last 1 Encounters:  01/02/24 94/64    Pulse:  89  Medical history was reviewed and updated. No contraindication to care.  Medical History[1] Surgical History[2] Social History[3] Family History[4] Medications Ordered Prior to Encounter[5]  Medical Risk Assessment ASA GRADE: ASA 1 - Normal health patient Pt recently delivered baby girl Dec 09, 2023.   Subjective: Patient reports no change to condition since last visit. Pt reports moderate dental anxiety.   Objective:  Radiographs taken: No radiographs captured at this appointment  Assessment: Patient presents for SRP for Stage I, grade B Quads/teeth: UR quad and LR quad.  Periodontal Evaluation Periodontal Charting: Updated full mouth periodontal charting and documented all findings in patient's tooth chart: No Calculus: Moderate sub- and supragingival calculus, radiographic calculus, moderate generalized plaque Generalized severe inflammation Tissue color: Red, consistency: Localized rolled and erthymatous margins Radiographic studies show mild generalized horizontal bone loss   Patient Oral Hygiene: Fair Last recall/SRP: first SRP today  Plan: Topical (Benzocaine  20%) placed. ANESTHETIC 1:Lidocaine  2% epi: 1:100K x Carpules: 3 carpules;  ANESTHETIC 2:Oraquix x Carpules: .5 carpule;  Scaling: Ultrasonic used for maxillary teeth and some mandibular; switched to hand scaling for mandibular teeth; pt unable to tolerate vibration/noise/water  on lower teeth Polish: To be completed at fine scale appointment Adjunctive recommendations were discussed including: Dispensed [employer] Gum Health with instructions and Paradontax toothpaste.  OHI: Reviewed brushing and flossing technique Additional notes: Used three carpules of lidocaine  for IA, ASA, MSA, and PSA blocks. Difficulty numbing areas due to inflammation, performed infiltrations on mandibular teeth. Applied Oraqix around anterior teeth for improved scaling, pt reported high sensitivity in these areas even with local anesthesia.   NV: Left side SRP in September, pt on cancellation list for opening sooner  Treatment Providers Mitchel Rocks, [facility] [address] [place] [id number] [phone]        [1] Past Medical History: Diagnosis Date  . Pregnancy   [2] History reviewed. No pertinent surgical history. [3] Social History Tobacco Use  . Smoking status: Never  . Smokeless tobacco: Never  Substance Use Topics  . Alcohol use: Never  . Drug use: Never  [4] Family History Problem Relation Name Age of Onset  . No Known Problems Mother    . No Known Problems Father    [5] Current Outpatient Medications on File Prior to Visit  Medication Sig Dispense Refill  . ferrous sulfate  325 mg (65 mg iron) tablet Take 1 tablet by mouth 1 (one) time each day with breakfast. (Patient not taking: Reported on 01/02/2024)    . fluconazole  (DIFLUCAN ) 150 mg tablet TAKE 1 TABLET (150 MG TOTAL) BY MOUTH ONCE FOR 1 DOSE. MAY REPEAT 3 DAYS LATER IF SYMPTOMS PERSIST (Patient not taking: Reported on 01/02/2024)    . fluoride, sodium, (PreviDent 5000 Booster Plus) 1.1 % paste Apply to teeth in the morning and at bedtime. Avoid eating and drinking for  30 minutes after use. (Patient not taking: Reported on 01/02/2024) 100 mL 0  . pantoprazole  (PROTONIX ) 40 mg EC tablet take 1 tablet by  mouth everyday at bedtime (Patient not taking: Reported on 01/02/2024)    . terconazole  (TERAZOL 3 ) 0.8 % vaginal cream PLACE 1 APPLICATOR VAGINALLY AT BEDTIME. APPLY NIGHTLY FOR THREE NIGHTS. (Patient not taking: Reported on 01/02/2024)    . terconazole  (TERAZOL 7 ) 0.4 % vaginal cream Insert 1 applicator into the vagina. (Patient not taking: Reported on 01/02/2024)     No current facility-administered medications on file prior to visit.

## 2024-01-12 ENCOUNTER — Telehealth: Payer: Self-pay

## 2024-01-12 NOTE — Telephone Encounter (Signed)
 Returned call, female answered and stated that the pt was not there at the moment.

## 2024-01-15 ENCOUNTER — Ambulatory Visit: Admitting: Advanced Practice Midwife

## 2024-03-11 ENCOUNTER — Encounter: Payer: Self-pay | Admitting: Obstetrics and Gynecology

## 2024-03-11 ENCOUNTER — Other Ambulatory Visit (HOSPITAL_COMMUNITY)
Admission: RE | Admit: 2024-03-11 | Discharge: 2024-03-11 | Disposition: A | Discharge status: Home / Self Care | Source: Ambulatory Visit | Attending: Obstetrics and Gynecology | Admitting: Obstetrics and Gynecology

## 2024-03-11 ENCOUNTER — Ambulatory Visit: Admitting: Obstetrics and Gynecology

## 2024-03-11 VITALS — BP 115/59 | HR 65 | Ht 64.0 in | Wt 162.0 lb

## 2024-03-11 DIAGNOSIS — N760 Acute vaginitis: Secondary | ICD-10-CM | POA: Diagnosis not present

## 2024-03-11 DIAGNOSIS — M79621 Pain in right upper arm: Secondary | ICD-10-CM

## 2024-03-11 DIAGNOSIS — N898 Other specified noninflammatory disorders of vagina: Secondary | ICD-10-CM | POA: Diagnosis present

## 2024-03-11 DIAGNOSIS — F419 Anxiety disorder, unspecified: Secondary | ICD-10-CM | POA: Diagnosis not present

## 2024-03-11 MED ORDER — CYCLOBENZAPRINE HCL 10 MG PO TABS: 10.0000 mg | ORAL_TABLET | Freq: Three times a day (TID) | ORAL | 0 refills | Status: DC | PRN

## 2024-03-11 MED ORDER — TRIAMCINOLONE ACETONIDE 0.1 % EX CREA: 1.0000 | TOPICAL_CREAM | Freq: Two times a day (BID) | CUTANEOUS | 0 refills | Status: DC

## 2024-03-11 MED ORDER — NYSTATIN 100000 UNIT/GM EX CREA
TOPICAL_CREAM | CUTANEOUS | 0 refills | Status: DC
Start: 2024-03-11 — End: 2024-06-06

## 2024-03-11 NOTE — Progress Notes (Signed)
 Post Partum Visit Note  Ashlee Johnson is a 28 y.o. [phone] female who presents for a postpartum visit. She is 13 weeks postpartum following a normal spontaneous vaginal delivery.  I have fully reviewed the prenatal and intrapartum course. The delivery was at 38 gestational weeks.  Anesthesia: epidural. Postpartum course has been good. Baby is doing well. Baby is feeding by bottle - Similac Neosure. Bleeding no bleeding. Bowel function is normal. Bladder function is normal. Patient is sexually active. Contraception method is none. Postpartum depression screening: negative.  Reports right axillary pain. Worse with movement.. does not happen all the time. Sometimes cause arm to hurt or tingle. Had this years ago and it went away    The pregnancy intention screening data noted above was reviewed. Potential methods of contraception were discussed. The patient elected to proceed with condoms   Edinburgh Postnatal Depression Scale - 03/11/24 0923       Edinburgh Postnatal Depression Scale:  In the Past 7 Days   I have been able to laugh and see the funny side of things. 0    I have looked forward with enjoyment to things. 0    I have blamed myself unnecessarily when things went wrong. 0    I have been anxious or worried for no good reason. 3    I have felt scared or panicky for no good reason. 3    Things have been getting on top of me. 0    I have been so unhappy that I have had difficulty sleeping. 0    I have felt sad or miserable. 0    I have been so unhappy that I have been crying. 0    The thought of harming myself has occurred to me. 0    Edinburgh Postnatal Depression Scale Total 6          Health Maintenance Due  Topic Date Due   HPV VACCINES (1 - 3-dose series) Never done   Hepatitis B Vaccines (1 of 3 - 19+ 3-dose series) Never done   COVID-19 Vaccine (1 - 2024-25 season) Never done   INFLUENZA VACCINE  03/08/2024    The following portions of the patient's history  were reviewed and updated as appropriate: allergies, current medications, past family history, past medical history, past social history, past surgical history, and problem list.  Review of Systems Pertinent items are noted in HPI.  Objective:  BP (!) 115/59   Pulse 65   Ht 5' 4 (1.626 m)   Wt 162 lb (73.5 kg)   BMI 27.81 kg/m    General:  alert and cooperative   Breasts:  normal, no lesion, rash, non tender to palpate right breast and axilla  Lungs: Normal effort     Abdomen: nondistended      GU exam:  Erythematous dry patch  suprapubic region, otherwise normal vulva       Assessment:   1. Anxiety (Primary) Desires counseling  - Ambulatory referral to [facility]  2. Postpartum exam  - Ambulatory referral to [facility]  3. Vaginal irritation  - Cervicovaginal ancillary only  4. Axillary pain, right Negative and normal exam, discussed precautions for follow up, will trial flexeril   - cyclobenzaprine  (FLEXERIL ) 10 MG tablet; Take 1 tablet (10 mg total) by mouth every 8 (eight) hours as needed for muscle spasms.  Dispense: 20 tablet; Refill: 0  5. Vulvovaginitis Discussed combining creams and apply BID   - nystatin  cream (MYCOSTATIN );  Apply thin layer to affected area twice a day  Dispense: 30 g; Refill: 0 - triamcinolone  cream (KENALOG ) 0.1 %; Apply 1 Application topically 2 (two) times daily.  Dispense: 30 g; Refill: 0   Plan:   Essential components of care per ACOG recommendations:  1.  Mood and well being: Patient with negative depression screening today. Reviewed local resources for support.  - Patient tobacco use? No.   - hx of drug use? No.    2. Infant care and feeding:  -Patient currently breastmilk feeding? No.  -Social determinants of health (SDOH) reviewed in EPIC. No concerns  3. Sexuality, contraception and birth spacing - Patient does not want a pregnancy in the next year. - Reviewed reproductive life  planning. Reviewed contraceptive methods based on pt preferences and effectiveness.  Patient desired Female Condom today.   - Discussed birth spacing of 18 months  4. Sleep and fatigue -Encouraged family/partner/community support of 4 hrs of uninterrupted sleep to help with mood and fatigue  5. Physical Recovery  - Discussed patients delivery and complications. She describes her labor as good. - Patient had a Vaginal, no problems at delivery. Patient had a 1st degree laceration. Perineal healing reviewed. Patient expressed understanding - Patient has urinary incontinence? No. - Patient is safe to resume physical and sexual activity  6.  Health Maintenance - HM due items addressed Yes - Last pap smear  Diagnosis  Date Value Ref Range Status  06/02/2023 - Benign reactive/reparative changes  Final   Pap smear not done at today's visit.  -Breast Cancer screening indicated? No.   7. Chronic Disease/Pregnancy Condition follow up: None  - PCP follow up  Nidia Daring, FNP [facility] [employer]

## 2024-03-11 NOTE — Progress Notes (Signed)
 Pt c/o vaginal irritation and pain in the right axillary.

## 2024-03-12 ENCOUNTER — Ambulatory Visit: Payer: Self-pay | Admitting: Obstetrics and Gynecology

## 2024-03-12 LAB — CERVICOVAGINAL ANCILLARY ONLY
Bacterial Vaginitis (gardnerella): NEGATIVE
Candida Glabrata: NEGATIVE
Candida Vaginitis: POSITIVE — AB
Chlamydia: NEGATIVE
Comment: NEGATIVE
Comment: NEGATIVE
Comment: NEGATIVE
Comment: NEGATIVE
Comment: NEGATIVE
Comment: NORMAL
Neisseria Gonorrhea: NEGATIVE
Trichomonas: NEGATIVE

## 2024-03-12 MED ORDER — FLUCONAZOLE 150 MG PO TABS: 150.0000 mg | ORAL_TABLET | Freq: Once | ORAL | 1 refills | Status: AC

## 2024-03-18 ENCOUNTER — Ambulatory Visit: Admitting: Licensed Clinical Social Worker

## 2024-03-18 NOTE — BH Specialist Note (Signed)
 Patient no showed for today's appointment

## 2024-04-23 DIAGNOSIS — K0530 Chronic periodontitis, unspecified: Secondary | ICD-10-CM | POA: Diagnosis not present

## 2024-04-23 DIAGNOSIS — S93601A Unspecified sprain of right foot, initial encounter: Secondary | ICD-10-CM | POA: Diagnosis not present

## 2024-04-23 DIAGNOSIS — X58XXXA Exposure to other specified factors, initial encounter: Secondary | ICD-10-CM | POA: Diagnosis not present

## 2024-05-25 ENCOUNTER — Emergency Department (HOSPITAL_BASED_OUTPATIENT_CLINIC_OR_DEPARTMENT_OTHER): Admitting: Radiology

## 2024-05-25 ENCOUNTER — Emergency Department (HOSPITAL_BASED_OUTPATIENT_CLINIC_OR_DEPARTMENT_OTHER)
Admission: EM | Admit: 2024-05-25 | Discharge: 2024-05-25 | Disposition: A | Discharge status: Home / Self Care | Attending: Emergency Medicine | Admitting: Emergency Medicine

## 2024-05-25 ENCOUNTER — Other Ambulatory Visit: Payer: Self-pay

## 2024-05-25 DIAGNOSIS — D72829 Elevated white blood cell count, unspecified: Secondary | ICD-10-CM | POA: Insufficient documentation

## 2024-05-25 DIAGNOSIS — R112 Nausea with vomiting, unspecified: Secondary | ICD-10-CM | POA: Diagnosis not present

## 2024-05-25 DIAGNOSIS — R202 Paresthesia of skin: Secondary | ICD-10-CM | POA: Insufficient documentation

## 2024-05-25 DIAGNOSIS — R200 Anesthesia of skin: Secondary | ICD-10-CM | POA: Insufficient documentation

## 2024-05-25 LAB — COMPREHENSIVE METABOLIC PANEL WITH GFR
ALT: 7 U/L (ref 0–44)
AST: 21 U/L (ref 15–41)
Albumin: 5 g/dL (ref 3.5–5.0)
Alkaline Phosphatase: 80 U/L (ref 38–126)
Anion gap: 19 — ABNORMAL HIGH (ref 5–15)
BUN: 8 mg/dL (ref 6–20)
CO2: 21 mmol/L — ABNORMAL LOW (ref 22–32)
Calcium: 10 mg/dL (ref 8.9–10.3)
Chloride: 101 mmol/L (ref 98–111)
Creatinine, Ser: 0.61 mg/dL (ref 0.44–1.00)
GFR, Estimated: >60 mL/min (ref >60)
Glucose, Bld: 85 mg/dL (ref 70–99)
Potassium: 3.3 mmol/L — ABNORMAL LOW (ref 3.5–5.1)
Sodium: 140 mmol/L (ref 135–145)
Total Bilirubin: 0.5 mg/dL (ref 0.0–1.2)
Total Protein: 8.2 g/dL — ABNORMAL HIGH (ref 6.5–8.1)

## 2024-05-25 LAB — CBC WITH DIFFERENTIAL/PLATELET
Abs Immature Granulocytes: 0.06 K/uL (ref 0.00–0.07)
Basophils Absolute: 0.1 K/uL (ref 0.0–0.1)
Basophils Relative: 0 %
Eosinophils Absolute: 0 K/uL (ref 0.0–0.5)
Eosinophils Relative: 0 %
HCT: 39.6 % (ref 36.0–46.0)
Hemoglobin: 13 g/dL (ref 12.0–15.0)
Immature Granulocytes: 0 %
Lymphocytes Relative: 22 %
Lymphs Abs: 3.9 K/uL (ref 0.7–4.0)
MCH: 26.9 pg (ref 26.0–34.0)
MCHC: 32.8 g/dL (ref 30.0–36.0)
MCV: 82 fL (ref 80.0–100.0)
Monocytes Absolute: 1.4 K/uL — ABNORMAL HIGH (ref 0.1–1.0)
Monocytes Relative: 8 %
Neutro Abs: 12.5 K/uL — ABNORMAL HIGH (ref 1.7–7.7)
Neutrophils Relative %: 70 %
Platelets: 381 K/uL (ref 150–400)
RBC: 4.83 MIL/uL (ref 3.87–5.11)
RDW: 16 % — ABNORMAL HIGH (ref 11.5–15.5)
WBC: 17.9 K/uL — ABNORMAL HIGH (ref 4.0–10.5)
nRBC: 0 % (ref 0.0–0.2)

## 2024-05-25 LAB — URINALYSIS, ROUTINE W REFLEX MICROSCOPIC
Bilirubin Urine: NEGATIVE
Glucose, UA: NEGATIVE mg/dL
Hgb urine dipstick: NEGATIVE
Ketones, ur: >80 mg/dL — AB
Leukocytes,Ua: NEGATIVE
Nitrite: NEGATIVE
Protein, ur: 30 mg/dL — AB
Specific Gravity, Urine: 1.027 (ref 1.005–1.030)
pH: 6 (ref 5.0–8.0)

## 2024-05-25 LAB — LIPASE, BLOOD: Lipase: 19 U/L (ref 11–51)

## 2024-05-25 LAB — CBC
HCT: 39.1 % (ref 36.0–46.0)
Hemoglobin: 12.8 g/dL (ref 12.0–15.0)
MCH: 26.8 pg (ref 26.0–34.0)
MCHC: 32.7 g/dL (ref 30.0–36.0)
MCV: 82 fL (ref 80.0–100.0)
Platelets: 353 K/uL (ref 150–400)
RBC: 4.77 MIL/uL (ref 3.87–5.11)
RDW: 15.8 % — ABNORMAL HIGH (ref 11.5–15.5)
WBC: 17.7 K/uL — ABNORMAL HIGH (ref 4.0–10.5)
nRBC: 0 % (ref 0.0–0.2)

## 2024-05-25 LAB — PREGNANCY, URINE: Preg Test, Ur: NEGATIVE

## 2024-05-25 MED ORDER — ONDANSETRON HCL 4 MG/2ML IJ SOLN
4.0000 mg | Freq: Once | INTRAMUSCULAR | Status: AC | PRN
  Administered 2024-05-25: 4 mg via INTRAVENOUS
  Filled 2024-05-25: qty 2

## 2024-05-25 MED ORDER — FAMOTIDINE IN NACL 20-0.9 MG/50ML-% IV SOLN: 20.0000 mg | Freq: Once | INTRAVENOUS | Status: DC

## 2024-05-25 MED ORDER — SODIUM CHLORIDE 0.9 % IV BOLUS
1000.0000 mL | Freq: Once | INTRAVENOUS | Status: AC
  Administered 2024-05-25: 1000 mL via INTRAVENOUS

## 2024-05-25 NOTE — ED Triage Notes (Addendum)
 Patient reports going out last night and having some libations with her brother. Today she has been nauseous and vomiting. She has not been able to keep food down and even water is coming back up.   She now she has bilateral hand numbness and tingling. She also notes right sided facial numbness. She says she feels a little light headed as well.  She denies any visual disturbances, weakness, or any other neuro deficits. She is tearful and anxious in triage.

## 2024-05-25 NOTE — ED Notes (Signed)
 Brought patient back to her room from waiting room. Now reporting nausea and dry heaves. Triage order set used and IV zofran  given and documented.

## 2024-05-25 NOTE — Discharge Instructions (Addendum)
 Imaging and lab work were reassuring today.  Please continue to drink plenty of fluids and eat as tolerated.  Take Tylenol  and ibuprofen  as needed for pain.  If any concerning symptoms arise please return to ED for further evaluation.  It is advised to follow-up with a primary care for further evaluation.
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
## 2024-05-25 NOTE — ED Notes (Signed)
 PO challenge complete per Provider order. Advised for Pt to use call light if they have any nausea and or vomiting. Pt was able to tolerate water without complaint and stated she is feeling much better.

## 2024-05-25 NOTE — ED Notes (Signed)
Pt states unable to provide urine at this time

## 2024-06-06 ENCOUNTER — Encounter (HOSPITAL_BASED_OUTPATIENT_CLINIC_OR_DEPARTMENT_OTHER): Payer: Self-pay | Admitting: Family Medicine

## 2024-06-06 ENCOUNTER — Ambulatory Visit (HOSPITAL_BASED_OUTPATIENT_CLINIC_OR_DEPARTMENT_OTHER): Admitting: Family Medicine

## 2024-06-06 VITALS — BP 118/68 | HR 70 | Ht 64.0 in | Wt 162.0 lb

## 2024-06-06 DIAGNOSIS — Z862 Personal history of diseases of the blood and blood-forming organs and certain disorders involving the immune mechanism: Secondary | ICD-10-CM | POA: Diagnosis not present

## 2024-06-06 DIAGNOSIS — R7301 Impaired fasting glucose: Secondary | ICD-10-CM | POA: Diagnosis not present

## 2024-06-06 DIAGNOSIS — N644 Mastodynia: Secondary | ICD-10-CM | POA: Insufficient documentation

## 2024-06-06 DIAGNOSIS — Z7689 Persons encountering health services in other specified circumstances: Secondary | ICD-10-CM

## 2024-06-06 DIAGNOSIS — R42 Dizziness and giddiness: Secondary | ICD-10-CM | POA: Diagnosis not present
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
## 2024-06-07 ENCOUNTER — Ambulatory Visit (HOSPITAL_BASED_OUTPATIENT_CLINIC_OR_DEPARTMENT_OTHER): Payer: Self-pay | Admitting: Family Medicine

## 2024-06-07 ENCOUNTER — Ambulatory Visit: Payer: Self-pay

## 2024-06-07 LAB — CBC WITH DIFFERENTIAL/PLATELET
Basophils Absolute: 0.1 x10E3/uL (ref 0.0–0.2)
Basos: 1 %
EOS (ABSOLUTE): 0.1 x10E3/uL (ref 0.0–0.4)
Eos: 1 %
Hematocrit: 38.3 % (ref 34.0–46.6)
Hemoglobin: 12 g/dL (ref 11.1–15.9)
Immature Grans (Abs): 0 x10E3/uL (ref 0.0–0.1)
Immature Granulocytes: 0 %
Lymphocytes Absolute: 2.4 x10E3/uL (ref 0.7–3.1)
Lymphs: 30 %
MCH: 26.7 pg (ref 26.6–33.0)
MCHC: 31.3 g/dL — ABNORMAL LOW (ref 31.5–35.7)
MCV: 85 fL (ref 79–97)
Monocytes Absolute: 0.6 x10E3/uL (ref 0.1–0.9)
Monocytes: 7 %
Neutrophils Absolute: 5 x10E3/uL (ref 1.4–7.0)
Neutrophils: 61 %
Platelets: 295 x10E3/uL (ref 150–450)
RBC: 4.49 x10E6/uL (ref 3.77–5.28)
RDW: 15 % (ref 11.7–15.4)
WBC: 8.1 x10E3/uL (ref 3.4–10.8)

## 2024-06-07 LAB — BASIC METABOLIC PANEL WITH GFR
BUN/Creatinine Ratio: 11 (ref 9–23)
BUN: 8 mg/dL (ref 6–20)
CO2: 18 mmol/L — ABNORMAL LOW (ref 20–29)
Calcium: 9.5 mg/dL (ref 8.7–10.2)
Chloride: 103 mmol/L (ref 96–106)
Creatinine, Ser: 0.72 mg/dL (ref 0.57–1.00)
Glucose: 80 mg/dL (ref 70–99)
Potassium: 4.4 mmol/L (ref 3.5–5.2)
Sodium: 138 mmol/L (ref 134–144)
eGFR: 118 mL/min/1.73 (ref >59)

## 2024-06-07 LAB — IRON,TIBC AND FERRITIN PANEL
Ferritin: 9 ng/mL — ABNORMAL LOW (ref 15–150)
Iron Saturation: 8 % — CL (ref 15–55)
Iron: 32 ug/dL (ref 27–159)
Total Iron Binding Capacity: 411 ug/dL (ref 250–450)
UIBC: 379 ug/dL (ref 131–425)

## 2024-06-07 LAB — HEMOGLOBIN A1C
Est. average glucose Bld gHb Est-mCnc: 100 mg/dL
Hgb A1c MFr Bld: 5.1 % (ref 4.8–5.6)

## 2024-06-07 LAB — VITAMIN B12: Vitamin B-12: 194 pg/mL — ABNORMAL LOW (ref 232–1245)

## 2024-06-07 NOTE — Progress Notes (Signed)
 Hi Ashlee Johnson, Your iron is low and causing iron deficiency.  This may be contributing to your symptoms.  I would like to get you started on a iron supplement and have you take this every other day for 3 days a week.  (Monday Wednesday Friday).  Your electrolytes, kidney and liver function were stable.  Your B12 is also low and may be contributing to symptoms.  We can get you started on B12 injections once a week for 1 month and then monthly for at least 3 months before transitioning to oral B12 replacement.  If you are agreeable to this please let me know and I will send in both medications for you.

## 2024-06-07 NOTE — Telephone Encounter (Signed)
 Sent mychart message to pt recommending her to go to either UC or ED for evaluation if she is still experiencing dizziness/lightheadedness due to office being closed until Monday Nov. 3

## 2024-06-07 NOTE — Telephone Encounter (Signed)
 FYI Only or Action Required?: Action required by provider: clinical question for provider.  Patient was last seen in primary care on 06/06/2024 by Knute Thersia Bitters, FNP.  Called Nurse Triage reporting Medication Problem.  Triage Disposition: Call PCP Now  Patient/caregiver understands and will follow disposition?: Yes     Copied from CRM #[id number]. Topic: Clinical - Red Word Triage >> Jun 07, 2024  1:52 PM Joesph NOVAK wrote: Red Word that prompted transfer to Nurse Triage: Dizzy and light headed. Fatigue. Patient is waiting for her provider to call in iron medication for her       Reason for Disposition  [1] Caller has URGENT medicine question about med that primary care doctor (or NP/PA) or specialist prescribed AND [2] triager unable to answer question  Answer Assessment - Initial Assessment Questions 1. NAME of MEDICINE: What medicine(s) are you calling about?     B12 injections and Iron supplement  2. QUESTION: What is your question? (e.g., double dose of medicine, side effect)     Patient would like the prescriptions sent today so that she doesn't have to wait the weekend to start treatment.  4. SYMPTOMS: Do you have any symptoms? If Yes, ask: What symptoms are you having?  How bad are the symptoms (e.g., mild, moderate, severe)     Dizziness, seen for the same yesterday  Protocols used: Medication Question Call-A-AH

## 2024-06-09 ENCOUNTER — Other Ambulatory Visit (HOSPITAL_BASED_OUTPATIENT_CLINIC_OR_DEPARTMENT_OTHER): Payer: Self-pay | Admitting: Family Medicine

## 2024-06-09 DIAGNOSIS — E611 Iron deficiency: Secondary | ICD-10-CM

## 2024-06-09 DIAGNOSIS — E538 Deficiency of other specified B group vitamins: Secondary | ICD-10-CM

## 2024-06-09 MED ORDER — ACCRUFER 30 MG PO CAPS: 1.0000 | ORAL_CAPSULE | ORAL | 2 refills | Status: AC

## 2024-06-11 ENCOUNTER — Other Ambulatory Visit (HOSPITAL_BASED_OUTPATIENT_CLINIC_OR_DEPARTMENT_OTHER): Payer: Self-pay | Admitting: Family Medicine

## 2024-06-11 MED ORDER — CYANOCOBALAMIN 1000 MCG/ML IJ SOLN: INTRAMUSCULAR | 0 refills | Status: DC

## 2024-06-11 NOTE — Telephone Encounter (Signed)
 Copied from CRM #[id number]. Topic: Clinical - Medication Refill >> Jun 11, 2024  3:48 PM Antony S wrote: Medication: cyanocobalamin (VITAMIN B12) 1000 MCG/ML injection  Has the patient contacted their pharmacy? Yes (Agent: If no, request that the patient contact the pharmacy for the refill. If patient does not wish to contact the pharmacy document the reason why and proceed with request.) (Agent: If yes, when and what did the pharmacy advise?)  This is the patient's preferred pharmacy:  [id number] [place], [place] - [address] [place] [address] Phone: [phone] Fax: [phone]    Is this the correct pharmacy for this prescription? Yes If no, delete pharmacy and type the correct one.   Has the prescription been filled recently? No  Is the patient out of the medication? Yes  Has the patient been seen for an appointment in the last year OR does the patient have an upcoming appointment? Yes  Can we respond through MyChart? Yes  Agent: Please be advised that Rx refills may take up to 3 business days. We ask that you follow-up with your pharmacy.

## 2024-06-12 ENCOUNTER — Telehealth (HOSPITAL_BASED_OUTPATIENT_CLINIC_OR_DEPARTMENT_OTHER): Payer: Self-pay | Admitting: Family Medicine

## 2024-06-12 NOTE — Telephone Encounter (Signed)
 Copied from CRM [id number]. Topic: Clinical - Medication Refill >> Jun 12, 2024  2:25 PM Carla L wrote: Medication: Patient states she received prescription for vitamin b12 injections but they did not come with syringes. Patient requesting prescription for syringes.    Has the patient contacted their pharmacy? Yes Told to contact office for syringes.    This is the patient's preferred pharmacy:  [id number] [place], [place] - [address] [place] [address] Phone: [phone] Fax: [phone]

## 2024-06-12 NOTE — Telephone Encounter (Unsigned)
 Copied from CRM [id number]. Topic: Clinical - Medication Refill >> Jun 12, 2024  2:25 PM Carla L wrote: Medication: Patient states she received prescription for vitamin b12 injections but they did not come with syringes. Patient requesting prescription for syringes.   Has the patient contacted their pharmacy? Yes Told to contact office for syringes.   This is the patient's preferred pharmacy:  [id number] [place], [place] - [address] [place] [address] Phone: [phone] Fax: [phone]  Is this the correct pharmacy for this prescription? Yes If no, delete pharmacy and type the correct one.   Has the prescription been filled recently? No  Is the patient out of the medication? Yes  Has the patient been seen for an appointment in the last year OR does the patient have an upcoming appointment? Yes  Can we respond through MyChart? Yes  Agent: Please be advised that Rx refills may take up to 3 business days. We ask that you follow-up with your pharmacy.

## 2024-06-13 ENCOUNTER — Ambulatory Visit
Admission: RE | Admit: 2024-06-13 | Discharge: 2024-06-13 | Disposition: A | Discharge status: Home / Self Care | Source: Ambulatory Visit | Attending: Family Medicine | Admitting: Family Medicine

## 2024-06-13 DIAGNOSIS — N644 Mastodynia: Secondary | ICD-10-CM

## 2024-06-13 DIAGNOSIS — N6489 Other specified disorders of breast: Secondary | ICD-10-CM | POA: Diagnosis not present

## 2024-06-13 NOTE — Progress Notes (Signed)
 Con,  Your breast ultrasound was negative for signs of an abnormality. We will plan to start screening mammograms at age 28. Please reach out if you have any questions.

## 2024-06-13 NOTE — Telephone Encounter (Signed)
 Message sent to provider about needles and syringes in a separate encounter.

## 2024-06-14 ENCOUNTER — Other Ambulatory Visit (HOSPITAL_BASED_OUTPATIENT_CLINIC_OR_DEPARTMENT_OTHER): Payer: Self-pay | Admitting: Family Medicine

## 2024-06-14 MED ORDER — BD ECLIPSE SYRINGE/NEEDLE 22G X 1" 3 ML MISC: 0 refills | Status: AC

## 2024-06-20 ENCOUNTER — Ambulatory Visit (HOSPITAL_BASED_OUTPATIENT_CLINIC_OR_DEPARTMENT_OTHER): Admitting: Family Medicine

## 2024-07-07 ENCOUNTER — Other Ambulatory Visit (HOSPITAL_BASED_OUTPATIENT_CLINIC_OR_DEPARTMENT_OTHER): Payer: Self-pay | Admitting: Family Medicine

## 2024-07-07 MED ORDER — CYANOCOBALAMIN 1000 MCG/ML IJ SOLN: INTRAMUSCULAR | 0 refills | Status: AC

## 2024-07-07 NOTE — Telephone Encounter (Signed)
 Please see mychart message sent by pt and advise.

## 2024-07-15 ENCOUNTER — Ambulatory Visit: Admitting: Dermatology

## 2024-07-26 ENCOUNTER — Other Ambulatory Visit: Payer: Self-pay | Admitting: Medical Genetics

## 2024-07-29 ENCOUNTER — Other Ambulatory Visit: Payer: Self-pay

## 2024-07-29 ENCOUNTER — Emergency Department (HOSPITAL_BASED_OUTPATIENT_CLINIC_OR_DEPARTMENT_OTHER)
Admission: EM | Admit: 2024-07-29 | Discharge: 2024-07-29 | Discharge status: Against Medical Advice | Attending: Emergency Medicine | Admitting: Emergency Medicine

## 2024-07-29 DIAGNOSIS — S0990XA Unspecified injury of head, initial encounter: Secondary | ICD-10-CM | POA: Insufficient documentation

## 2024-07-29 DIAGNOSIS — X58XXXA Exposure to other specified factors, initial encounter: Secondary | ICD-10-CM | POA: Diagnosis not present

## 2024-07-29 DIAGNOSIS — Z5321 Procedure and treatment not carried out due to patient leaving prior to being seen by health care provider: Secondary | ICD-10-CM

## 2024-07-29 NOTE — ED Provider Notes (Signed)
 I went to assess patient in room.  Patient not present in room for assessment.   Chany Woolworth A, PA-C 07/29/24 2206    Armenta Canning, MD 07/30/24 [phone]
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
## 2024-10-07 ENCOUNTER — Encounter (HOSPITAL_BASED_OUTPATIENT_CLINIC_OR_DEPARTMENT_OTHER): Admitting: Family Medicine

## 2024-10-14 ENCOUNTER — Other Ambulatory Visit (HOSPITAL_COMMUNITY)

## 2024-11-18 ENCOUNTER — Ambulatory Visit: Admitting: Dermatology
